# Patient Record
Sex: Male | Born: 1950 | Race: Black or African American | Hispanic: No | Marital: Married | Smoking: Former smoker
Health system: Southern US, Community
[De-identification: ages and names within clinical notes are randomized; demographics above are authoritative.]

## PROBLEM LIST (undated history)

## (undated) DIAGNOSIS — I5032 Chronic diastolic (congestive) heart failure: Secondary | ICD-10-CM

## (undated) DIAGNOSIS — F1911 Other psychoactive substance abuse, in remission: Secondary | ICD-10-CM

## (undated) DIAGNOSIS — Z8673 Personal history of transient ischemic attack (TIA), and cerebral infarction without residual deficits: Secondary | ICD-10-CM

## (undated) DIAGNOSIS — I10 Essential (primary) hypertension: Secondary | ICD-10-CM

## (undated) DIAGNOSIS — Z8709 Personal history of other diseases of the respiratory system: Secondary | ICD-10-CM

## (undated) DIAGNOSIS — I272 Pulmonary hypertension, unspecified: Secondary | ICD-10-CM

## (undated) DIAGNOSIS — Z86718 Personal history of other venous thrombosis and embolism: Secondary | ICD-10-CM

## (undated) DIAGNOSIS — I482 Chronic atrial fibrillation, unspecified: Secondary | ICD-10-CM

## (undated) DIAGNOSIS — G4733 Obstructive sleep apnea (adult) (pediatric): Secondary | ICD-10-CM

## (undated) DIAGNOSIS — N183 Chronic kidney disease, stage 3 unspecified: Secondary | ICD-10-CM

## (undated) DIAGNOSIS — E1140 Type 2 diabetes mellitus with diabetic neuropathy, unspecified: Secondary | ICD-10-CM

## (undated) DIAGNOSIS — E785 Hyperlipidemia, unspecified: Secondary | ICD-10-CM

## (undated) HISTORY — DX: Other psychoactive substance abuse, in remission: F19.11

## (undated) HISTORY — DX: Obstructive sleep apnea (adult) (pediatric): G47.33

## (undated) HISTORY — DX: Personal history of transient ischemic attack (TIA), and cerebral infarction without residual deficits: Z86.73

## (undated) HISTORY — DX: Essential (primary) hypertension: I10

## (undated) HISTORY — DX: Chronic atrial fibrillation, unspecified: I48.20

## (undated) HISTORY — DX: Personal history of other venous thrombosis and embolism: Z86.718

## (undated) HISTORY — DX: Chronic kidney disease, stage 3 unspecified: N18.30

## (undated) HISTORY — DX: Chronic kidney disease, stage 3 (moderate): N18.3

## (undated) HISTORY — DX: Type 2 diabetes mellitus with diabetic neuropathy, unspecified: E11.40

## (undated) HISTORY — DX: Chronic diastolic (congestive) heart failure: I50.32

## (undated) HISTORY — DX: Pulmonary hypertension, unspecified: I27.20

## (undated) HISTORY — DX: Morbid (severe) obesity due to excess calories: E66.01

## (undated) HISTORY — DX: Hyperlipidemia, unspecified: E78.5

## (undated) HISTORY — DX: Personal history of other diseases of the respiratory system: Z87.09

---

## 2004-02-20 ENCOUNTER — Emergency Department: Payer: Self-pay | Admitting: Emergency Medicine

## 2006-11-20 ENCOUNTER — Other Ambulatory Visit: Payer: Self-pay

## 2006-11-20 ENCOUNTER — Inpatient Hospital Stay: Payer: Self-pay | Admitting: Internal Medicine

## 2007-01-11 ENCOUNTER — Emergency Department: Payer: Self-pay | Admitting: Emergency Medicine

## 2007-01-11 ENCOUNTER — Other Ambulatory Visit: Payer: Self-pay

## 2008-02-21 ENCOUNTER — Other Ambulatory Visit: Payer: Self-pay

## 2008-02-21 ENCOUNTER — Emergency Department: Payer: Self-pay | Admitting: Emergency Medicine

## 2008-07-24 IMAGING — CT CT HEAD WITHOUT CONTRAST
2 series · 16 of 30 positions shown, 20 images · non-contrast
Comparison: none

REASON FOR EXAM: dizziness
COMMENTS:

PROCEDURE:     CT  - CT HEAD WITHOUT CONTRAST  - November 20, 2006 [DATE]
RESULT:      No intraaxial or extraaxial pathologic fluid or blood
collections are identified.  No mass lesion is noted. No hydrocephalus.
Mucosal thickening is noted of the frontal, ethmoid and maxillary sinuses.

[Series 2: without · axial · non-contrast · 0.39mm/px · z∈[+490,+616]mm · 13 of 31 slices shown, 17 images]
[im 3/31  brain]
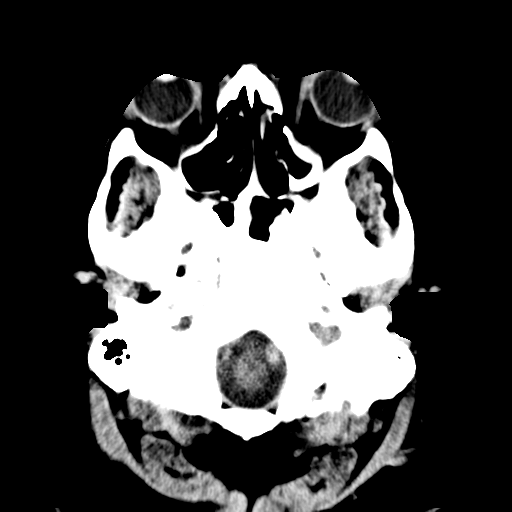
[im 3/31  bone]
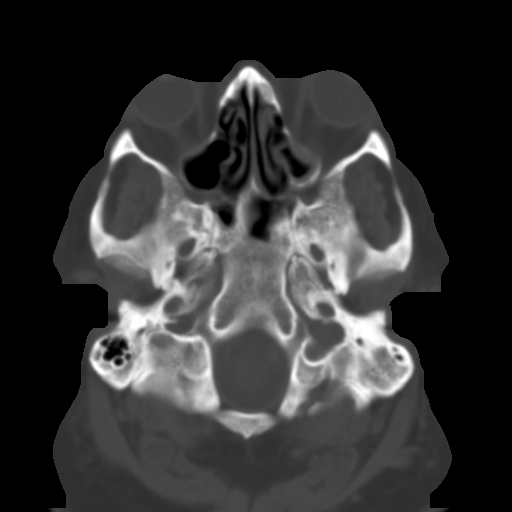
[im 5/31  brain]
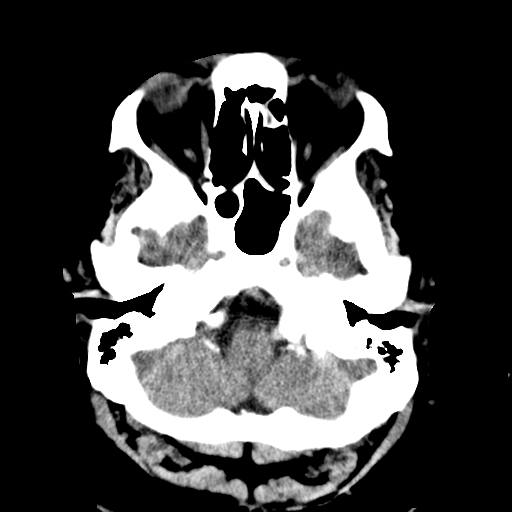
[im 7/31  brain]
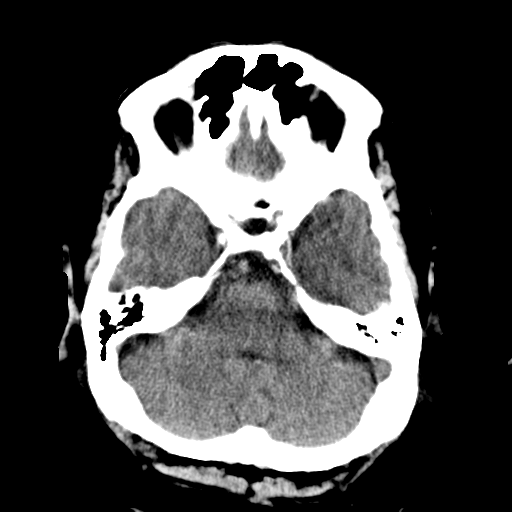
[im 9/31  brain]
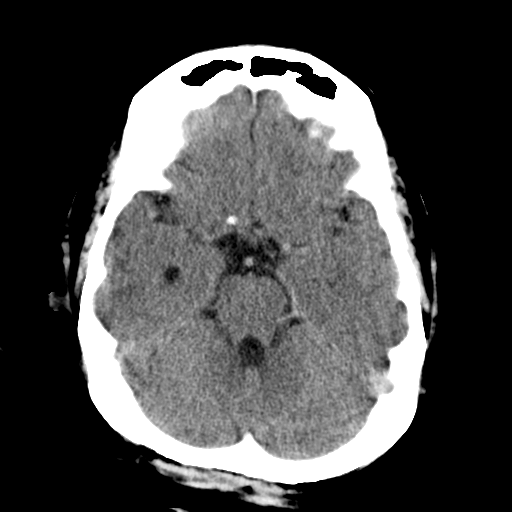
[im 11/31  brain]
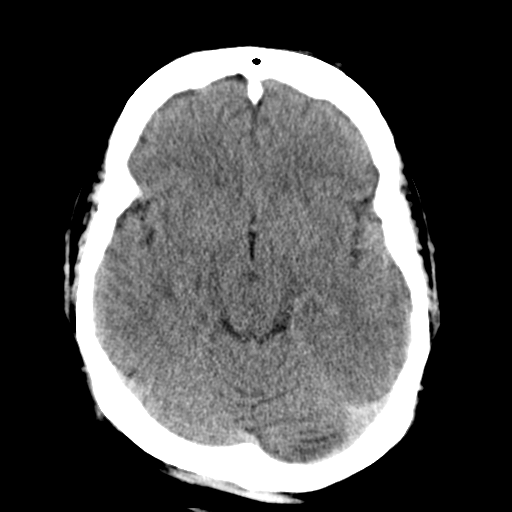
[im 11/31  bone]
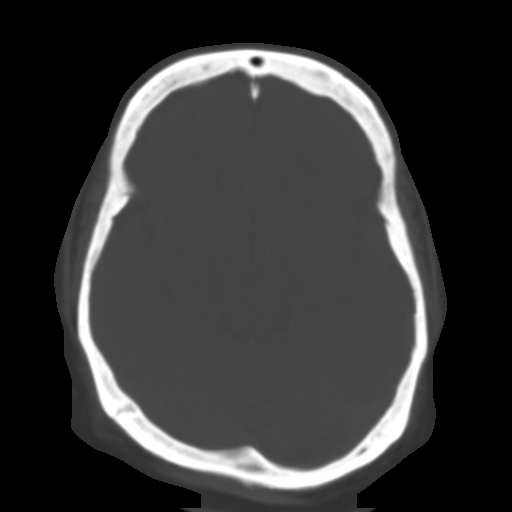
[im 13/31  brain]
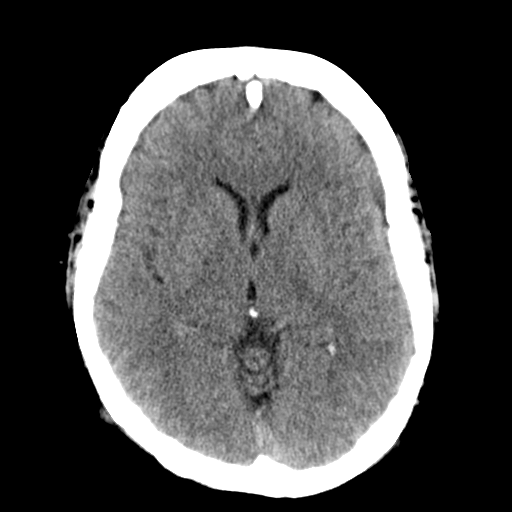
[im 16/31  brain]
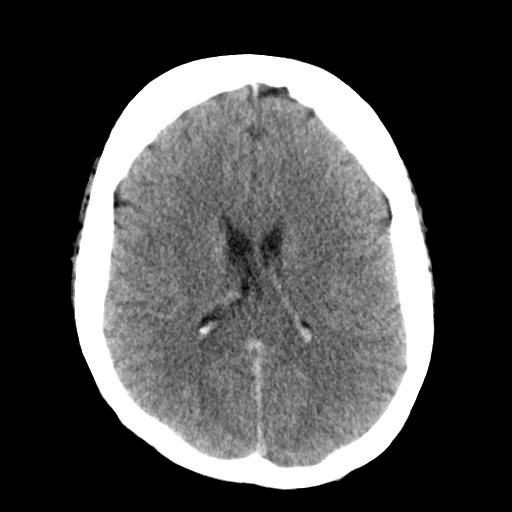
[im 18/31  brain]
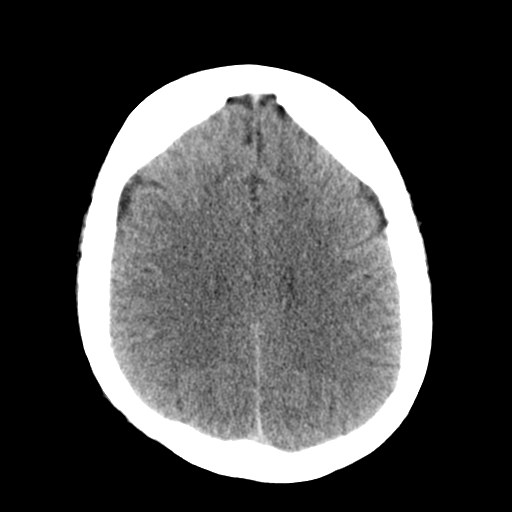
[im 20/31  brain]
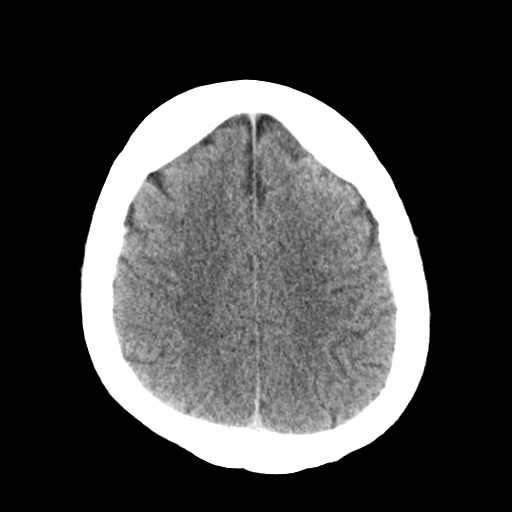
[im 20/31  bone]
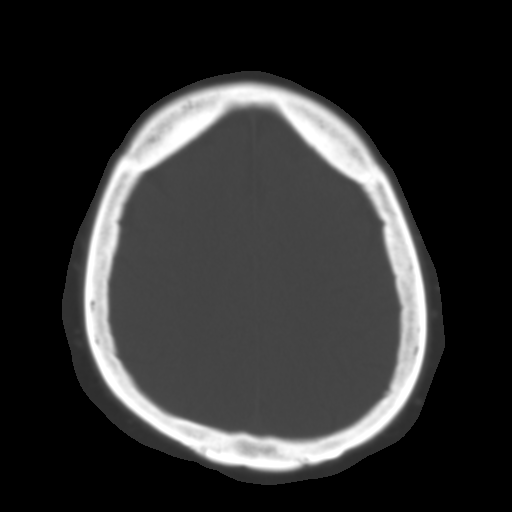
[im 22/31  brain]
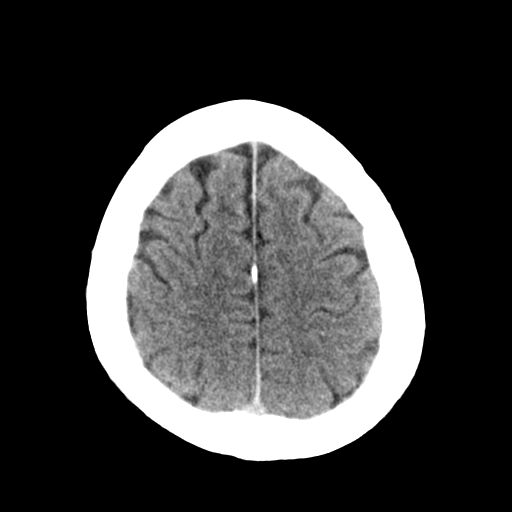
[im 24/31  brain]
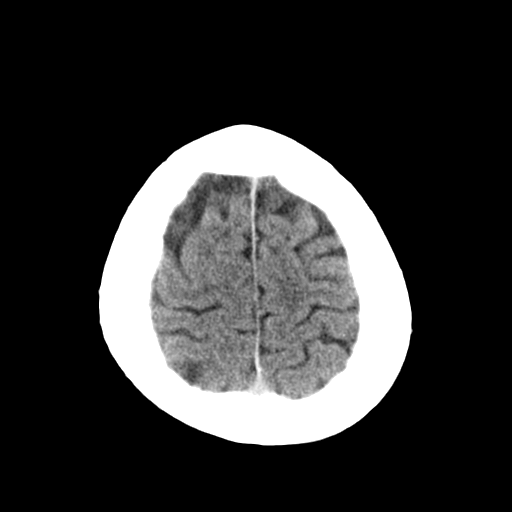
[im 26/31  brain]
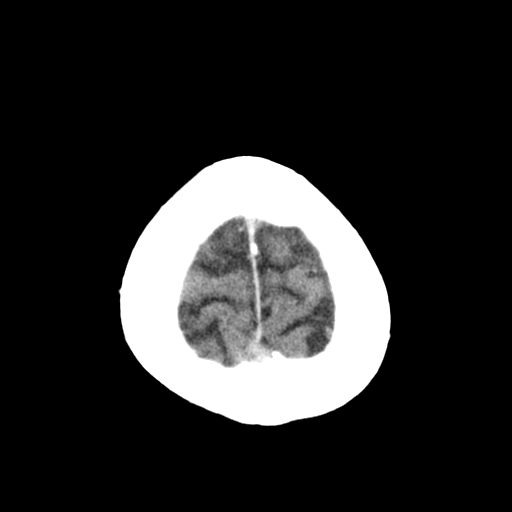
[im 28/31  brain]
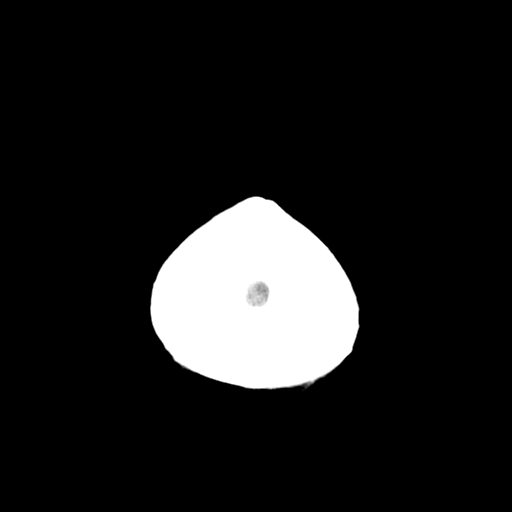
[im 28/31  bone]
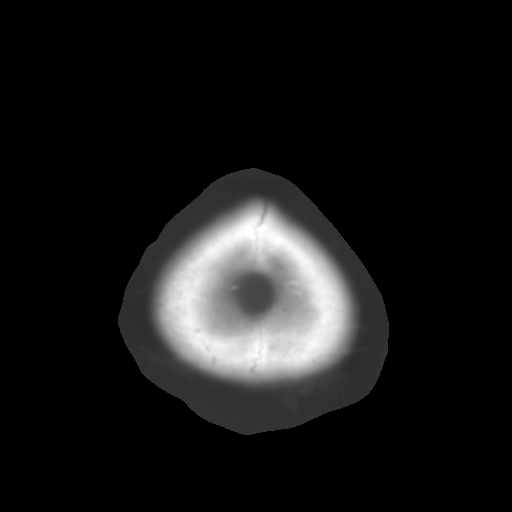

[Series 3: bone · axial · 0.39mm/px · z∈[+490,+530]mm · 3 of 31 slices shown]
[im 3/31  bone]
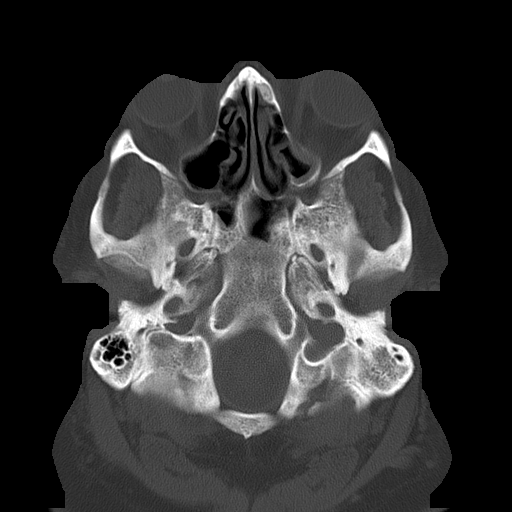
[im 7/31  bone]
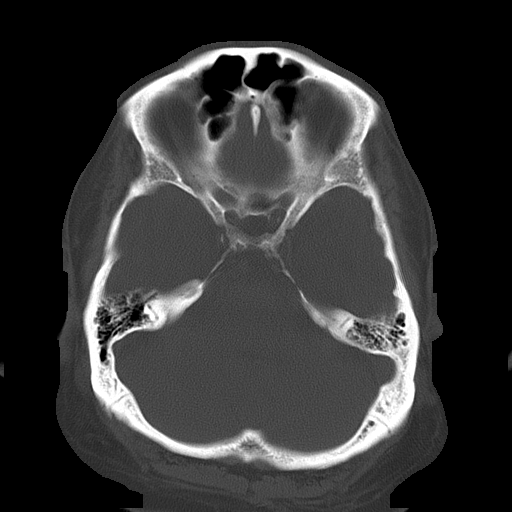
[im 11/31  bone]
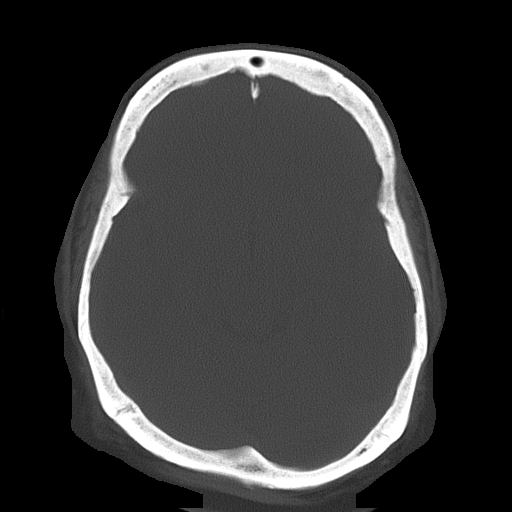

[16 of 30 positions shown; findings below may reference images not displayed]

IMPRESSION: 1.     No acute intracranial abnormality is identified.
2.     Diffuse ethmoid and maxillary chronic sinusitis.
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
## 2008-12-03 ENCOUNTER — Inpatient Hospital Stay: Payer: Self-pay | Admitting: Specialist

## 2008-12-12 ENCOUNTER — Inpatient Hospital Stay: Payer: Self-pay | Admitting: Internal Medicine

## 2009-02-13 ENCOUNTER — Inpatient Hospital Stay: Payer: Self-pay | Admitting: Internal Medicine

## 2009-03-02 ENCOUNTER — Inpatient Hospital Stay: Payer: Self-pay | Admitting: Internal Medicine

## 2009-03-20 ENCOUNTER — Inpatient Hospital Stay: Payer: Self-pay | Admitting: Internal Medicine

## 2009-05-18 ENCOUNTER — Inpatient Hospital Stay: Payer: Self-pay | Admitting: Internal Medicine

## 2009-06-10 ENCOUNTER — Inpatient Hospital Stay: Payer: Self-pay | Admitting: Internal Medicine

## 2010-11-06 ENCOUNTER — Observation Stay: Payer: Self-pay | Admitting: Specialist

## 2010-11-07 DIAGNOSIS — I369 Nonrheumatic tricuspid valve disorder, unspecified: Secondary | ICD-10-CM

## 2010-12-26 ENCOUNTER — Emergency Department: Payer: Self-pay | Admitting: Emergency Medicine

## 2011-02-12 IMAGING — CR DG CHEST 1V PORT
1 series · 1 of 1 positions shown · non-contrast
Comparison: none

REASON FOR EXAM: Chest Pain
COMMENTS:

[view not recorded]
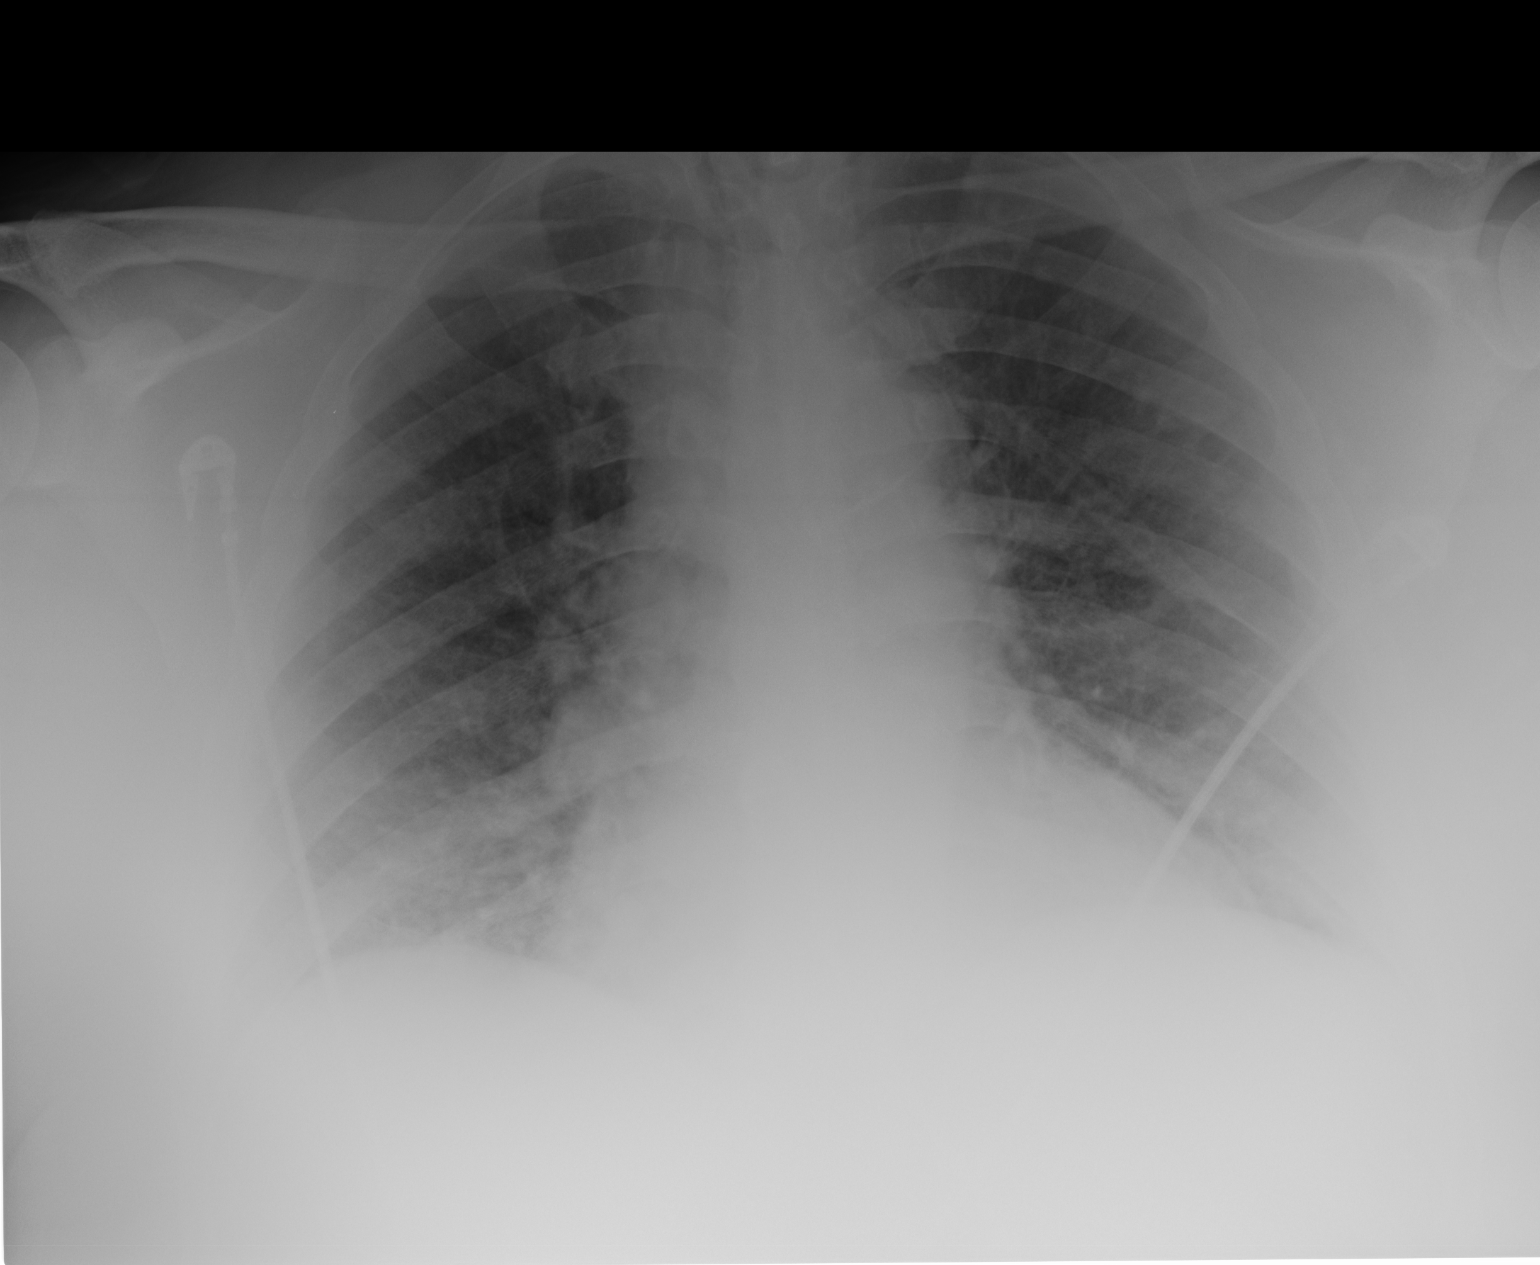

[1 of 1 positions shown; findings below may reference images not displayed]

PROCEDURE:     DXR - DXR PORTABLE CHEST SINGLE VIEW  - June 10, 2009  [DATE]

RESULT:     Comparison is made to a prior exam of 05/18/2009. No pneumonia is
seen. There is slight thickening of the interstitial markings bilaterally.
This has been present previously and may represent chronic inflammatory
change. The possibility of minimal chronic pulmonary vascular congestion
cannot be entirely excluded. Overall, the chest does not appear appreciably
changed as compared to the exam of 05/18/2009. The heart is mildly enlarged.
No pleural effusion is seen. Monitoring electrodes are present.
IMPRESSION: 1. There is mild thickening of the interstitial lung markings. Since the
appearance has been present previously it is uncertain as to whether this is
secondary to chronic inflammatory change or recurrent interstitial edema.
2. No consolidated infiltrative changes indicative of pneumonia are seen.

## 2011-03-08 ENCOUNTER — Inpatient Hospital Stay: Payer: Self-pay | Admitting: Internal Medicine

## 2012-05-29 ENCOUNTER — Inpatient Hospital Stay: Payer: Self-pay | Admitting: Internal Medicine

## 2012-05-29 DIAGNOSIS — I4891 Unspecified atrial fibrillation: Secondary | ICD-10-CM

## 2012-05-29 LAB — DRUG SCREEN, URINE
Amphetamines, Ur Screen: NEGATIVE (ref ?–1000)
Barbiturates, Ur Screen: NEGATIVE (ref ?–200)
MDMA (Ecstasy)Ur Screen: POSITIVE (ref ?–500)
Methadone, Ur Screen: NEGATIVE (ref ?–300)

## 2012-05-29 LAB — TROPONIN I
Troponin-I: 0.04 ng/mL
Troponin-I: 0.11 ng/mL — ABNORMAL HIGH
Troponin-I: 0.08 ng/mL — ABNORMAL HIGH

## 2012-05-29 LAB — PROTIME-INR
INR: 2.3
Prothrombin Time: 25.7 secs — ABNORMAL HIGH (ref 11.5–14.7)

## 2012-05-29 LAB — CBC
HCT: 49.3 % (ref 40.0–52.0)
HGB: 16 g/dL (ref 13.0–18.0)
MCH: 27.1 pg (ref 26.0–34.0)
Platelet: 198 10*3/uL (ref 150–440)
WBC: 14.4 10*3/uL — ABNORMAL HIGH (ref 3.8–10.6)

## 2012-05-29 LAB — BASIC METABOLIC PANEL
Anion Gap: 5 — ABNORMAL LOW (ref 7–16)
EGFR (Non-African Amer.): 35 — ABNORMAL LOW
Glucose: 105 mg/dL — ABNORMAL HIGH (ref 65–99)
Potassium: 4.1 mmol/L (ref 3.5–5.1)

## 2012-05-29 LAB — CK TOTAL AND CKMB (NOT AT ARMC): CK, Total: 253 U/L — ABNORMAL HIGH (ref 35–232)

## 2012-05-30 LAB — CBC WITH DIFFERENTIAL/PLATELET
Basophil #: 0 10*3/uL (ref 0.0–0.1)
Eosinophil %: 1.1 %
HGB: 15 g/dL (ref 13.0–18.0)
Lymphocyte #: 2 10*3/uL (ref 1.0–3.6)
MCHC: 31.5 g/dL — ABNORMAL LOW (ref 32.0–36.0)
Monocyte #: 0.7 x10 3/mm (ref 0.2–1.0)
Monocyte %: 5.2 %
Neutrophil %: 78.2 %
WBC: 13 10*3/uL — ABNORMAL HIGH (ref 3.8–10.6)

## 2012-05-30 LAB — BASIC METABOLIC PANEL
Anion Gap: 6 — ABNORMAL LOW (ref 7–16)
BUN: 21 mg/dL — ABNORMAL HIGH (ref 7–18)
Co2: 29 mmol/L (ref 21–32)
Creatinine: 1.38 mg/dL — ABNORMAL HIGH (ref 0.60–1.30)
EGFR (African American): >60
EGFR (Non-African Amer.): 55 — ABNORMAL LOW
Glucose: 217 mg/dL — ABNORMAL HIGH (ref 65–99)
Osmolality: 285 (ref 275–301)
Sodium: 138 mmol/L (ref 136–145)

## 2012-05-31 LAB — BASIC METABOLIC PANEL
Anion Gap: 6 — ABNORMAL LOW (ref 7–16)
Calcium, Total: 9 mg/dL (ref 8.5–10.1)
Chloride: 100 mmol/L (ref 98–107)
Co2: 30 mmol/L (ref 21–32)
EGFR (African American): >60
EGFR (Non-African Amer.): 53 — ABNORMAL LOW

## 2012-05-31 LAB — CBC WITH DIFFERENTIAL/PLATELET
Basophil %: 0.5 %
HCT: 47.8 % (ref 40.0–52.0)
HGB: 15 g/dL (ref 13.0–18.0)
Lymphocyte #: 2.5 10*3/uL (ref 1.0–3.6)
Lymphocyte %: 22.6 %
MCH: 26.1 pg (ref 26.0–34.0)
MCHC: 31.5 g/dL — ABNORMAL LOW (ref 32.0–36.0)
MCV: 83 fL (ref 80–100)
Monocyte %: 6.4 %
Platelet: 198 10*3/uL (ref 150–440)
RBC: 5.76 10*6/uL (ref 4.40–5.90)
WBC: 11.1 10*3/uL — ABNORMAL HIGH (ref 3.8–10.6)

## 2012-06-01 DIAGNOSIS — I4891 Unspecified atrial fibrillation: Secondary | ICD-10-CM

## 2012-06-01 LAB — PROTIME-INR: INR: 3

## 2012-06-01 LAB — CBC WITH DIFFERENTIAL/PLATELET
Basophil #: 0 10*3/uL (ref 0.0–0.1)
Eosinophil #: 0.2 10*3/uL (ref 0.0–0.7)
Eosinophil %: 2.3 %
HGB: 15.5 g/dL (ref 13.0–18.0)
Lymphocyte #: 2.4 10*3/uL (ref 1.0–3.6)
Lymphocyte %: 22.9 %
MCH: 27 pg (ref 26.0–34.0)
MCV: 84 fL (ref 80–100)
Monocyte #: 0.6 x10 3/mm (ref 0.2–1.0)
Monocyte %: 5.8 %
Neutrophil %: 68.6 %
Platelet: 206 10*3/uL (ref 150–440)
RBC: 5.73 10*6/uL (ref 4.40–5.90)
RDW: 20 % — ABNORMAL HIGH (ref 11.5–14.5)

## 2012-06-01 LAB — BASIC METABOLIC PANEL
Anion Gap: 6 — ABNORMAL LOW (ref 7–16)
BUN: 19 mg/dL — ABNORMAL HIGH (ref 7–18)
Chloride: 99 mmol/L (ref 98–107)
Co2: 31 mmol/L (ref 21–32)
Creatinine: 1.61 mg/dL — ABNORMAL HIGH (ref 0.60–1.30)
EGFR (African American): 53 — ABNORMAL LOW
EGFR (Non-African Amer.): 46 — ABNORMAL LOW
Glucose: 367 mg/dL — ABNORMAL HIGH (ref 65–99)
Osmolality: 289 (ref 275–301)

## 2012-06-02 LAB — BASIC METABOLIC PANEL
Anion Gap: 5 — ABNORMAL LOW (ref 7–16)
BUN: 21 mg/dL — ABNORMAL HIGH (ref 7–18)
Calcium, Total: 9.2 mg/dL (ref 8.5–10.1)
Chloride: 98 mmol/L (ref 98–107)
Creatinine: 1.63 mg/dL — ABNORMAL HIGH (ref 0.60–1.30)
EGFR (Non-African Amer.): 45 — ABNORMAL LOW
Glucose: 281 mg/dL — ABNORMAL HIGH (ref 65–99)

## 2012-06-02 LAB — PROTIME-INR: INR: 3.4

## 2012-06-03 LAB — BASIC METABOLIC PANEL
Anion Gap: 4 — ABNORMAL LOW (ref 7–16)
Co2: 33 mmol/L — ABNORMAL HIGH (ref 21–32)
Creatinine: 1.57 mg/dL — ABNORMAL HIGH (ref 0.60–1.30)
EGFR (African American): 55 — ABNORMAL LOW
EGFR (Non-African Amer.): 47 — ABNORMAL LOW
Glucose: 318 mg/dL — ABNORMAL HIGH (ref 65–99)
Osmolality: 284 (ref 275–301)
Sodium: 134 mmol/L — ABNORMAL LOW (ref 136–145)

## 2012-06-03 LAB — PROTIME-INR
INR: 3.4
Prothrombin Time: 34.3 secs — ABNORMAL HIGH (ref 11.5–14.7)

## 2012-06-03 LAB — DIGOXIN LEVEL: Digoxin: 0.8 ng/mL

## 2012-06-04 DIAGNOSIS — I4891 Unspecified atrial fibrillation: Secondary | ICD-10-CM

## 2012-06-04 LAB — BASIC METABOLIC PANEL
Anion Gap: 7 (ref 7–16)
Calcium, Total: 9.3 mg/dL (ref 8.5–10.1)
Co2: 32 mmol/L (ref 21–32)
Creatinine: 1.66 mg/dL — ABNORMAL HIGH (ref 0.60–1.30)
EGFR (Non-African Amer.): 44 — ABNORMAL LOW
Osmolality: 284 (ref 275–301)
Potassium: 4.4 mmol/L (ref 3.5–5.1)

## 2012-06-04 LAB — PROTIME-INR: INR: 2.7

## 2012-06-10 ENCOUNTER — Encounter: Payer: Self-pay | Admitting: *Deleted

## 2012-06-14 ENCOUNTER — Ambulatory Visit (INDEPENDENT_AMBULATORY_CARE_PROVIDER_SITE_OTHER): Payer: Medicare Other | Admitting: Nurse Practitioner

## 2012-06-14 ENCOUNTER — Encounter: Payer: Self-pay | Admitting: Nurse Practitioner

## 2012-06-14 VITALS — BP 110/70 | HR 112 | Ht 62.0 in | Wt 396.4 lb

## 2012-06-14 DIAGNOSIS — I482 Chronic atrial fibrillation, unspecified: Secondary | ICD-10-CM

## 2012-06-14 DIAGNOSIS — R0602 Shortness of breath: Secondary | ICD-10-CM

## 2012-06-14 DIAGNOSIS — G4733 Obstructive sleep apnea (adult) (pediatric): Secondary | ICD-10-CM

## 2012-06-14 DIAGNOSIS — E1142 Type 2 diabetes mellitus with diabetic polyneuropathy: Secondary | ICD-10-CM

## 2012-06-14 DIAGNOSIS — I272 Pulmonary hypertension, unspecified: Secondary | ICD-10-CM | POA: Insufficient documentation

## 2012-06-14 DIAGNOSIS — I5032 Chronic diastolic (congestive) heart failure: Secondary | ICD-10-CM

## 2012-06-14 DIAGNOSIS — Z8709 Personal history of other diseases of the respiratory system: Secondary | ICD-10-CM | POA: Insufficient documentation

## 2012-06-14 DIAGNOSIS — E1140 Type 2 diabetes mellitus with diabetic neuropathy, unspecified: Secondary | ICD-10-CM | POA: Insufficient documentation

## 2012-06-14 DIAGNOSIS — Z86718 Personal history of other venous thrombosis and embolism: Secondary | ICD-10-CM | POA: Insufficient documentation

## 2012-06-14 DIAGNOSIS — E1149 Type 2 diabetes mellitus with other diabetic neurological complication: Secondary | ICD-10-CM

## 2012-06-14 DIAGNOSIS — Z79899 Other long term (current) drug therapy: Secondary | ICD-10-CM

## 2012-06-14 DIAGNOSIS — I509 Heart failure, unspecified: Secondary | ICD-10-CM

## 2012-06-14 DIAGNOSIS — F1911 Other psychoactive substance abuse, in remission: Secondary | ICD-10-CM | POA: Insufficient documentation

## 2012-06-14 DIAGNOSIS — I10 Essential (primary) hypertension: Secondary | ICD-10-CM

## 2012-06-14 DIAGNOSIS — I4891 Unspecified atrial fibrillation: Secondary | ICD-10-CM

## 2012-06-14 DIAGNOSIS — F1910 Other psychoactive substance abuse, uncomplicated: Secondary | ICD-10-CM

## 2012-06-14 DIAGNOSIS — Z8673 Personal history of transient ischemic attack (TIA), and cerebral infarction without residual deficits: Secondary | ICD-10-CM | POA: Insufficient documentation

## 2012-06-14 DIAGNOSIS — E785 Hyperlipidemia, unspecified: Secondary | ICD-10-CM | POA: Insufficient documentation

## 2012-06-14 MED ORDER — METOPROLOL TARTRATE 100 MG PO TABS: 100.0000 mg | ORAL_TABLET | Freq: Two times a day (BID) | ORAL | Status: AC

## 2012-06-14 NOTE — Progress Notes (Signed)
Patient Name: Seth Dudley Date of Encounter: 06/14/2012  Primary Care Provider:  [facility] Primary Cardiologist:  Concha Se, MD  Patient Profile  62 y/o Af. Am male with recent admission for diast chf and rapid afib who presents for f/u.  Problem List   Past Medical History  Diagnosis Date  . History of TIA (transient ischemic attack)   . History of CVA (cerebrovascular accident)   . Diabetes mellitus with neuropathy   . Morbid obesity   . Hypertension   . Obstructive sleep apnea     a. On CPAP.  Marland Kitchen Chronic diastolic CHF (congestive heart failure)     a. EF 55%  . Pulmonary hypertension   . Chronic a-fib     a. rate-controlled and on coumadin  . History of DVT (deep vein thrombosis)     a. chronic coumadin  . History of COPD     a. on home O2  . History of substance abuse   . Hyperlipidemia   . Chronic kidney disease, stage III (moderate)    History reviewed. No pertinent past surgical history.  Allergies  Allergies  Allergen Reactions  . Citalopram   . Lisinopril   . Vancomycin     HPI  62 y/o male with the above problem list.  He was recently admitted to [facility] on 1/15 with acute on chronic diastolic chf in the setting of rapid afib and acute on chronic renal failure.  During admission, he was diuresed and his meds were adjusted to achieve modest rate control.  He ended up requiring digoxin, high dose coreg and diltiazem, along with amio 200 bid, just to lower his resting HR to 110.  He is chronically on coumadin.  He was d/c'd 10 days ago to a rehab facility and he reports that he has been doing well.  His weight has apparently been stable, though he has not been weighed daily.  He has been working with P.T and says that he was able to walk 80 ft yesterday w/o significant DOE.  He denies chest pain, palpitations, pnd, orthopnea, n, v, dizziness, syncope, or early satiety.  He has chronic LEE and wears wraps daily.  He believes his edema is significantly  improved overall, though he still has edema.  Home Medications  Prior to Admission medications   Medication Sig Start Date End Date Taking? Authorizing Provider  allopurinol (ZYLOPRIM) 100 MG tablet Take 200 mg by mouth daily.   Yes Historical Provider, MD  amiodarone (PACERONE) 200 MG tablet Take 200 mg by mouth 2 (two) times daily.   Yes Historical Provider, MD  atorvastatin (LIPITOR) 80 MG tablet Take 80 mg by mouth daily.   Yes Historical Provider, MD  budesonide-formoterol (SYMBICORT) 160-4.5 MCG/ACT inhaler Inhale 2 puffs into the lungs 2 (two) times daily.   Yes Historical Provider, MD  cholecalciferol (VITAMIN D) 1000 UNITS tablet Take 3,000 Units by mouth daily.   Yes Historical Provider, MD  digoxin (LANOXIN) 0.125 MG tablet Take 0.125 mg by mouth daily.   Yes Historical Provider, MD  diltiazem (TIAZAC) 360 MG 24 hr capsule Take 360 mg by mouth daily.   Yes Historical Provider, MD  docusate sodium (COLACE) 100 MG capsule Take 200 mg by mouth 2 (two) times daily.   Yes Historical Provider, MD  gabapentin (NEURONTIN) 300 MG capsule Take 300 mg by mouth 3 (three) times daily.   Yes Historical Provider, MD  insulin aspart protamine-insulin aspart (NOVOLOG 70/30) (70-30) 100 UNIT/ML injection Inject  into the skin as directed.   Yes Historical Provider, MD  insulin glargine (LANTUS) 100 UNIT/ML injection Inject 55 Units into the skin 2 (two) times daily.   Yes Historical Provider, MD  Ipratropium-Albuterol (COMBIVENT) 20-100 MCG/ACT AERS respimat Inhale 2 puffs into the lungs daily.   Yes Historical Provider, MD  isosorbide dinitrate (ISORDIL) 30 MG tablet Take 30 mg by mouth daily.   Yes Historical Provider, MD  nicotine (NICODERM CQ - DOSED IN MG/24 HOURS) 21 mg/24hr patch Place 1 patch onto the skin daily.   Yes Historical Provider, MD  NON FORMULARY Caps aicin 0.025% topical cream two times daily.   Yes Historical Provider, MD  omeprazole (PRILOSEC) 20 MG capsule Take 20 mg by mouth 2  (two) times daily.   Yes Historical Provider, MD  sennosides-docusate sodium (SENOKOT-S) 8.6-50 MG tablet Take 2 tablets by mouth 2 (two) times daily.   Yes Historical Provider, MD  torsemide (DEMADEX) 20 MG tablet Take 40 mg by mouth daily.    Yes Historical Provider, MD  warfarin (COUMADIN) 5 MG tablet Take 5 mg by mouth daily.   Yes Historical Provider, MD  metoprolol (LOPRESSOR) 100 MG tablet Take 1 tablet (100 mg total) by mouth 2 (two) times daily. 06/14/12   Ok Anis, NP    Review of Systems  As above, he believes he is doing well.  All other systems reviewed and are otherwise negative except as noted above.  Physical Exam  Blood pressure 110/70, pulse 112, height 5\' 2"  (1.575 m), weight 396 lb 6 oz (179.795 kg).  General: Pleasant, NAD Psych: Normal affect. Neuro: Alert and oriented X 3. Moves all extremities spontaneously. HEENT: Normal  Neck: Supple without bruits.  Obese - difficult to assess JVP. Lungs:  Resp regular and unlabored, diminished throughout with scattered rhonchi. Heart: IR, IR, no s3, s4, or murmurs. Abdomen: Soft, non-tender, non-distended, obese, BS + x 4.  Extremities: No clubbing, cyanosis.  Lower legs are wrapped - 1-2+ pitting edema noted.  PT/Radials 2+ and equal bilaterally.  Accessory Clinical Findings  ECG - afib 112, poor R progression.  Assessment & Plan  1.  Chronic diastolic CHF:  He appears to be stable.  He has chronic LEE, though he reports this has been better.  He is able to participate in P.T w/o significant dyspnea and feels that he is getting stronger.  His BP is stable, though his HR remains elevated at rest.  I will switch him from coreg to metoprolol 100mg  bid to see if we can achieve better rate control.  He is already on max dose diltiazem, moderate dose amiodarone, and digoxin.  He will need to be weighed daily and weight gains of 2lbs/day or 5lbs/week should be reported to our office as he may require diuretic adjustment.   I will check a bmet today to evaluate renal fxn on torsemide, given his h/o CKD.  2.  Afib: chronic.  Difficult to rate control.  Adjusting bb regimen as above.  Given his diastolic dysfxn, achieving adequate rate control is important with goal HR of at least < 100.  He is otw asymptomatic.  If rate control remains elusive we may consider referral to EP, though given comorbidities and body habitus, we are likely limited in more invasive approaches (afib ablation/avn ablation and ppm placement) and rhythm control with meds alone would likely prove difficult.  3.  COPD:  On home O2.  He no longer smokes.  4.  OSA:  Uses cpap.  5.  Morbid obesity:  Taking P.T.  He knows he needs to lose weight but does not have a plan.  6.  Drug Abuse:  Urine screen + for MDMA ('molly') during hospitalization.  He is currently at rehab center and not using.  7.  Dispo:  bmet and dig level today.  F/u in 2 wks to reassess rate control.   Nicolasa Ducking, NP 06/14/2012, 9:15 AM

## 2012-06-14 NOTE — Patient Instructions (Addendum)
Your physician wants you to follow-up in: 2 weeks with Dr. Mariah Milling. You will receive a reminder letter in the mail two months in advance. If you don't receive a letter, please call our office to schedule the follow-up appointment.  Your physician has recommended you make the following change in your medication:  -stop coreg/carvedilol -start metoprolol tartrate 100 mg twice daily

## 2012-06-14 NOTE — Addendum Note (Signed)
Addended by: Festus Aloe on: 06/14/2012 09:32 AM   Modules accepted: Orders

## 2012-06-21 ENCOUNTER — Telehealth: Payer: Self-pay

## 2012-06-21 NOTE — Telephone Encounter (Signed)
Trying to track down labs ordered by [place] 1/31 Pt says he lives at [facility] and these labs were drawn there. I will call to get results

## 2012-06-21 NOTE — Telephone Encounter (Signed)
Attempted to reach someone taking care of pt at [facility] Unable to get anyone to come to phone Will try again Monday 2/10

## 2012-06-24 NOTE — Telephone Encounter (Signed)
I spoke with rep at [facility] She says pt was d/c last week When I asked for these lab results, she states she does not have these, like they were never drawn I will make Ward Givens, NP aware

## 2012-06-27 ENCOUNTER — Ambulatory Visit: Payer: Medicare Other | Admitting: Cardiovascular Disease

## 2012-07-04 ENCOUNTER — Ambulatory Visit: Payer: Medicare Other | Admitting: Cardiovascular Disease

## 2012-07-26 ENCOUNTER — Ambulatory Visit: Payer: Medicare Other | Admitting: Cardiovascular Disease

## 2014-01-31 IMAGING — CR DG CHEST 1V PORT
1 series · 1 of 1 positions shown · non-contrast
Comparison: none

REASON FOR EXAM: chest pain dyspnea
COMMENTS:

[ap]
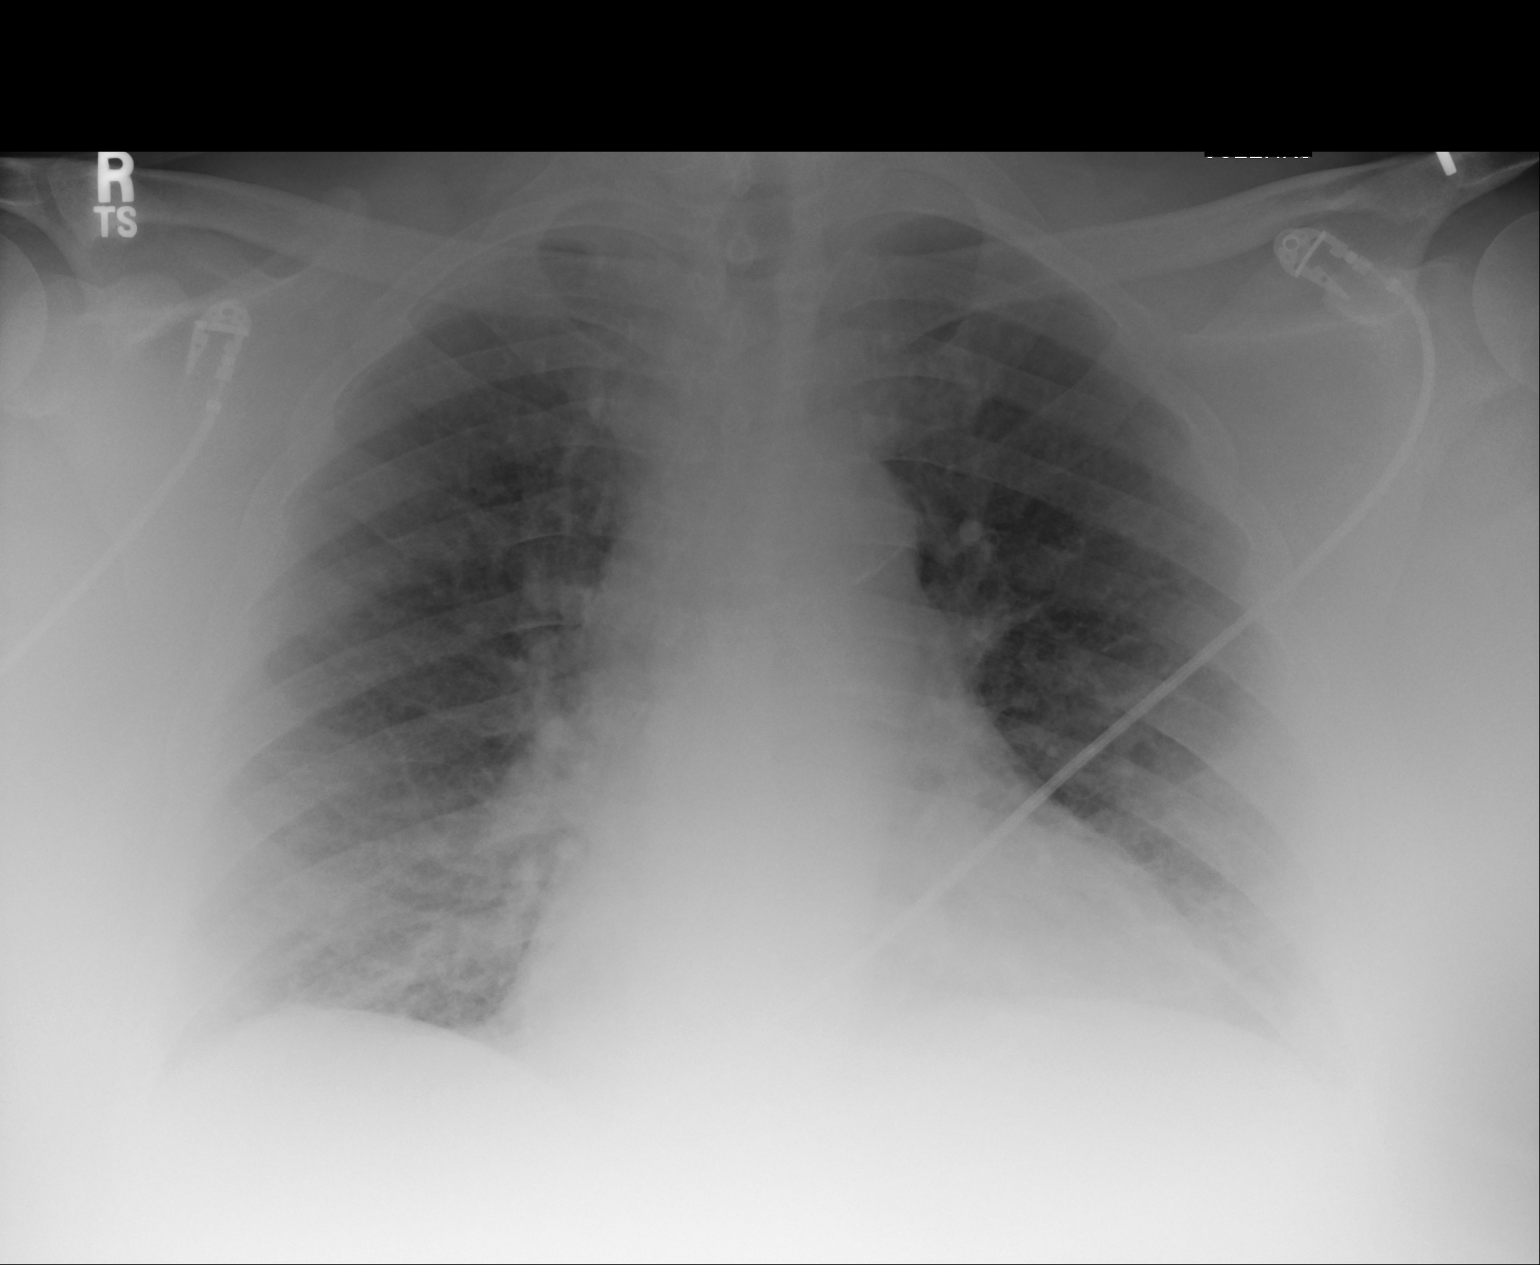

[1 of 1 positions shown; findings below may reference images not displayed]

PROCEDURE:     DXR - DXR PORTABLE CHEST SINGLE VIEW  - May 29, 2012  [DATE]

RESULT:     Comparison is made to the study March 07, 2011.

The lungs are adequately inflated. The cardiac silhouette is enlarged. The
pulmonary vascularity is prominent centrally and the interstitial markings
are increased. There is no pleural effusion.
IMPRESSION: The findings are consistent with low-grade CHF with mild
interstitial edema. There is no focal pneumonia nor evidence of a pleural
effusion. A followup PA and lateral chest x-ray would be useful when the
patient can tolerate the procedure.

[REDACTED]

## 2014-09-04 NOTE — Consult Note (Signed)
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   he was lying in bed he had episode of chest pain with shortness of breath, midsternal and radiating to the left shoulder. Symptoms seem to wax and wane from midnight for the next several hours and he could not sleep. As he was very symptomatic, he called EMTs.  Upon presentation to the ED, the patient was found to be in atrial fibrillation with RVR with heart rate in the 180s. The patient was given IV metoprolol, which slowed his heart rate to the 140s. He was given nitro paste which resolved his chest pain. The patient was given another dose of IV Cardizem.  Lab work showed elevated BNP    Present Illness . DRUG ALLERGIES: CITALOPRAM, LISINOPRIL AND VANCOMYCIN.   PAST SURGICAL HISTORY: None.   SOCIAL HISTORY: Smokes 1 pack per day. No alcohol or IV drug use.   FAMILY HISTORY: Significant for diabetes and coronary artery disease.   Physical Exam:   GEN well developed, well nourished, no acute distress, obese    HEENT red conjunctivae    NECK supple  No masses    RESP normal resp effort  clear BS    CARD Irregular rate and rhythm  Tachycardic  No murmur    ABD denies tenderness  soft    LYMPH negative neck    EXTR positive edema, Lymphedema    SKIN normal to palpation    NEURO motor/sensory function intact    PSYCH alert, A+O to time, place, person, good insight   Review of Systems:   Subjective/Chief Complaint Shortness of breath, chest tightness    General: Fatigue  Weakness     Skin: No Complaints    ENT: No Complaints    Eyes: No Complaints    Neck: No Complaints    Respiratory: Frequent cough  Short of breath    Cardiovascular: Dyspnea    Gastrointestinal: No Complaints    Genitourinary: No Complaints    Vascular: No Complaints    Musculoskeletal: No Complaints    Neurologic: No Complaints    Hematologic: No Complaints    Endocrine: No Complaints    Psychiatric: No Complaints    Review of Systems: All other systems were reviewed and found to be negative    Medications/Allergies Reviewed Medications/Allergies reviewed     Gout:    a-fib:    CHF:    TIA x2:    Hypercholesterolemia:    ETOH:    Drug Abuse:    Diabetes Mellitus,Type I (IDD):    MI x 2:    HTN:        Admit Diagnosis:   ATRIAL FIBRILLATION: 29-May-2012, Active, ATRIAL FIBRILLATION      Admit Reason:   AF (atrial fibrillation): (427.31) Active, ICD9, Atrial fibrillation      Chronic Problem:   Chronic left-sided CHF (congestive heart failure): (428.1) Active, ICD9, Left heart failure   Acute on chronic diastolic heart failure: (287.68) Active, ICD9, Acute on chronic diastolic heart failure  Home Medications: Medication Instructions Status  Coumadin 7 milligram(s) orally once a day at 5pm on [place], [place]  SATURDAY SUNDAY.  Active  Coumadin 6 mg oral tablet 1 tab(s) orally once a day at 5pm on MONDAY, [facility], FRIDAY Active  allopurinol 100 mg oral tablet 2 tab(s) orally once a day Active  albuterol-ipratropium CFC free 100 mcg-20 mcg/inh inhalation aerosol 1 puff(s) inhaled 2 times a day Active  acetaminophen 325 mg oral tablet 1-2 tab(s) orally every 4 to 6 hours, As Needed- for Pain  Active  atorvastatin 80 mg oral tablet 1 tab(s) orally once a day (at bedtime) Active  budesonide-formoterol 160 mcg-4.5 mcg/inh inhalation aerosol 2 puff(s) inhaled 2 times a day Active  capsaicin 0.025% topical cream Apply topically to affected area 2 times a day  Active  Colace 100 mg oral capsule 2 cap(s) orally 2 times a day Active  Vitamin D3 1000 intl units oral tablet 3 tab(s) orally once a day Active  Senna S 50 mg-8.6 mg oral tablet 2 tab(s) orally 2 times a day Active  gabapentin 300 mg oral capsule 1 cap(s) orally 3 times a day Active  Novolin R 100 units/mL injectable solution unit(s) injectable 3 times a day Active  insulin glargine 100 units/mL subcutaneous solution 55 unit(s) subcutaneous 2 times a day Active  losartan 25 mg oral tablet 1 tab(s) orally once a day Active  omeprazole 20 mg oral delayed release capsule 1 cap(s) orally 2 times a day Active  acetaminophen-oxycodone 325 mg-5 mg oral tablet 1 tab(s) orally 4 times a day, As Needed- for Pain  Active  spironolactone 25 mg oral tablet 1 tab(s) orally once a day Active  sildenafil 100 mg oral tablet 1 tab(s) orally once a day, As Needed for sexual activity Active  torsemide 20 mg oral tablet 1 tab(s) orally once a day Active  carvedilol 12.5 mg oral tablet 1 tab(s) orally 2 times a day Active   Lab Results:  Routine Chem:  15-Jan-14 05:43    B-Type Natriuretic Peptide [facility])  1382 (Result(s) reported on 29 May 2012 at 06:36AM.)   Glucose, Serum  105   BUN  25   Creatinine (comp)  2.03   Sodium, Serum 142   Potassium, Serum 4.1   Chloride, Serum 107   CO2, Serum 30   Calcium (Total), Serum 9.0   Anion Gap  5   Osmolality (calc) 288   eGFR (African American)  40   eGFR (Non-African American)  35 (eGFR values <27m/min/1.73 m2 may be an indication of chronic kidney disease (CKD). Calculated eGFR is useful in patients with stable renal function. The eGFR calculation will not be reliable in acutely ill patients when serum creatinine is changing rapidly. It is not useful in  patients on dialysis. The eGFR calculation may not be applicable to patients at the low and high extremes of body sizes, pregnant women, and vegetarians.)  Cardiac:  15-Jan-14 05:43    Troponin I 0.04  (0.00-0.05 0.05 ng/mL or less: NEGATIVE  Repeat testing in 3-6 hrs  if clinically indicated. >0.05 ng/mL: POTENTIAL  MYOCARDIAL INJURY. Repeat  testing in 3-6 hrs if  clinically indicated. NOTE: An increase or decrease  of 30% or more on serial  testing suggests a  clinically important change)   CK, Total  253   CPK-MB, Serum 1.9 (Result(s) reported on 29 May 2012 at 0[facility])  Routine Coag:  15-Jan-14 05:43    Prothrombin  25.7   INR 2.3 (INR reference interval applies to patients on anticoagulant therapy. A single INR therapeutic range for coumarins is not optimal for all  indications; however, the suggested range for most indications is 2.0 - 3.0. Exceptions to the INR Reference Range may include: Prosthetic heart valves, acute myocardial infarction, prevention of myocardial infarction, and combinations of aspirin and anticoagulant. The need for a higher or lower target INR must be assessed individually. Reference: The Pharmacology and Management of the Vitamin K  antagonists: the seventh ACCP Conference on Antithrombotic and Thrombolytic Therapy. VIFBP.7943 Sept:126 (3suppl): [id number]. A HCT value >55% may artifactually increase the PT.  In one study,  the increase was an average of 25%. Reference:  "Effect on Routine and Special Coagulation Testing Values of Citrate Anticoagulant Adjustment in Patients with High HCT Values." American Journal of Clinical Pathology 2006;126:400-405.)  Routine Hem:  15-Jan-14 05:43    WBC (CBC)  14.4   RBC (CBC)  5.92   Hemoglobin (CBC) 16.0   Hematocrit (CBC) 49.3   Platelet Count (CBC) 198 (Result(s) reported on 29 May 2012 at 06:24AM.)   MCV 83   MCH 27.1   MCHC 32.6   RDW  20.2   EKG:   Interpretation EKG shows atrial fibrillation with rate 180 bpm.    Citalopram: Unknown  Lisinopril: Unknown  Vancomycin HCl: Unknown  Vital Signs/Nurse's Notes: **Vital Signs.:   15-Jan-14 10:20   Vital Signs Type Admission   Temperature  Temperature (F) 97.8   Celsius 36.5   Temperature Source oral   Pulse Pulse 125   Respirations Respirations 14   Systolic BP Systolic BP 276   Diastolic BP (mmHg) Diastolic BP (mmHg) 56   Mean BP 75   Pulse Ox % Pulse Ox % 94   Pulse Ox Activity Level  At rest   Oxygen Delivery 2L     Impression 64 year old [place] American male with morbid obesity , diabetes, hypertension, obstructive sleep apnea with CPAP, history of CVA, diastolic congestive heart failure with ejection fraction more than 55%, history of chronic kidney disease with baseline creatinine of 1.7 to 1.8, mild pulmonary hypertension, chronic atrial fibrillation and 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  1) atrial fibrillation with RVR Currently on diltiazem infusion  This can be  changed to diltiazem by mouth, 60 mg every 6 hours if blood pressure tolerates Could also restart Coreg 12.5 mg twice a day If rate continues to be fast, would add digoxin I suspect his atrial fibrillation has been a chronic thing. He reports that it started earlier this year. No prior EKGs available. [place] records not available. --Not a good candidate for cardioversion given his body habitus and the need for general anesthesia. If he has been in atrial fibrillation chronically, would likely be unsuccessful in maintaining normal sinus rhythm  2) Morbid obesity: Likely contributing to his diastolic CHF, hypoventilation issues  3) chronic diastolic CHF Will likely need continued diuretic regimen given his body habitus, atrial fibrillation, history of diastolic CHF We'll need to watch renal function carefully  4) diabetes Per medical service   Electronic Signatures: Ida Rogue (MD)  (Signed 15-Jan-14 18:10)  Authored: General Aspect/Present Illness, History and Physical Exam, Review of System, Past Medical History, Health Issues, Home  Medications, Labs, EKG , Allergies, Vital Signs/Nurse's Notes, Impression/Plan   Last Updated: 15-Jan-14 18:10 by Ida Rogue (MD)

## 2014-09-04 NOTE — H&P (Signed)
PATIENT NAME:  Seth Dudley, Seth Dudley MR#:  [id number] DATE OF BIRTH:  1950/10/02  DATE OF ADMISSION:  05/29/2012  REFERRING PHYSICIAN: Bayard Males, MD  PRIMARY CARE PHYSICIAN: [facility]  CHIEF COMPLAINT: Chest pain and shortness of breath.   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 The patient reports while he was lying in bed he had episode of chest pain. It was accompanied by shortness of breath. He described chest pain as midsternal and radiating to the left shoulder. Denies any nausea, vomiting, sweating, diaphoresis, palpitations or dizziness. It was accompanied by mild shortness of breath. Upon presentation to the ED, the patient was found to be in atrial fibrillation with RVR with heart rate in the 180s. The patient was given IV metoprolol, which slowed his heart rate to the 140s. He was given nitro paste which resolved his chest pain. The patient was given another dose of IV Cardizem which did not help with lowering his heart rate, so hospitalist service was requested to admit the patient for further management and treatment for his afib with RVR. The patient had elevated proBNP, but his chest x-ray did not show any volume overload. He has baseline lower extremity edema which he reports has not worsened recently. The patient is known to have history of chronic kidney disease with baseline creatinine of 1.7 to 1.8; currently it is more than 2. As well he is known to be on warfarin for DVT and afib with his INR  being therapeutic at 2.3. Unfortunately, the patient did not bring the dosages of his medications so the dosages are unknown at this point and he reports his wife will bring the list when she comes to visit him this afternoon.   PAST MEDICAL HISTORY: 1. History of TIA and CVA in the past.  2. Diabetes mellitus with neuropathy.  3. Morbid obesity.  4. Hypertension.  5. Obstructive sleep apnea with CPAP and 3 liters oxygen.  6. History of CVA. 7. Echocardiogram showing diastolic CHF with EF of 55% and pulmonary hypertension.  8. Chronic afib.  9. History of DVT, on Warfarin. 10. History of COPD, not on oxygen at home for COPD, only at night for obstructive sleep apnea.  11. History of substance abuse.  12. Hyperlipidemia.   HOME MEDICATIONS: 1. Coreg 12.5 mg p.o. 2 times a day. 2. Gabapentin 600 mg 3 times daily. 3. Losartan. 4. Pravastatin.  5. Warfarin.  6. Lantus.  7. Spironolactone. 8. Torsemide.   DRUG ALLERGIES: CITALOPRAM, LISINOPRIL AND VANCOMYCIN.   PAST SURGICAL HISTORY: None.   SOCIAL HISTORY: Smokes 1 pack per day. No alcohol or IV drug use.   FAMILY HISTORY: Significant for diabetes and coronary artery disease.   REVIEW OF SYSTEMS:  CONSTITUTIONAL: Denies any fever or chills. Complains of generalized weakness and fatigue.   EYES: Denies blurry vision, double vision, pain, redness or inflammation.   ENT: Denies tinnitus, ear pain, hearing loss, epistaxis or discharge.   RESPIRATORY: Denies cough, wheezing or hemoptysis. Has complaints of dyspnea.   CARDIOVASCULAR:  Has complaints of chest pain, resolved. Has baseline edema. Has  complaints of palpitations.   GASTROINTESTINAL: Denies nausea, vomiting, diarrhea, abdominal pain, hematemesis, jaundice or rectal bleed.   GENITOURINARY: Denies dysuria, hematuria or renal colic.  ENDOCRINE: Denies polyuria, polydipsia, heat or cold intolerance.   HEMATOLOGY: Denies anemia, easy bruising or bleeding diathesis.    INTEGUMENTARY: Denies acne, rash or skin lesions.   MUSCULOSKELETAL: Has complaints of gout, arthritis, swelling of lower extremities and complaints of chronic lower back pain.   NEUROLOGIC: Denies ataxia, dementia, headache, migraine, seizures or memory loss.   PSYCHIATRIC: Denies anxiety, insomnia, schizophrenia, bipolar disorder or alcohol abuse.   PHYSICAL EXAMINATION:  VITAL SIGNS: Temperature is 98.7, pulse 127, respiratory rate 26, blood pressure 129/84 and saturating 94% on oxygen.   GENERAL: Morbidly obese male who lies comfortable in bed in no apparent distress.   HEENT: Head atraumatic, normocephalic. Pupils equal and reactive to light. Pink conjunctivae. Anicteric sclerae. Moist oral mucosa. 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   NECK: Supple. No thyromegaly. No JVD.   CARDIOVASCULAR: The patient had good air entry bilaterally with mild rales at the bases. No wheezing. No use of accessory muscles.   CARDIOVASCULAR: S1 and S2 heard. No rubs, murmurs or gallops. Regular rate and rhythm.   EXTREMITIES: The patient has +2 to 3 bilateral lower extremity edema. Extremities tender to touch which he reports is due to neuropathy.   ABDOMEN: Obese, soft, nontender and nondistended. Bowel sounds present.   RECTAL: Deferred.  MUSCULOSKELETAL: Able to move all extremities. No cyanosis. No degenerative joint disease or kyphosis.   SKIN: No rash. Normal skin turgor.   LYMPH: No adenopathy in cervical region.   PSYCHIATRIC: Appropriate affect. Awake and alert x 3. Intact judgment and insight.   NEUROLOGIC: Cranial nerves grossly intact. As mentioned earlier with left eye ptosis since birth.   PERTINENT LABS: Glucose 105, proBNP 1382, BUN 25, creatinine 2.03, sodium 142, potassium 4.1, chloride 107 and CO2 30. Troponin 0.04. CK-MB 1.9. White blood cells 14.4, hemoglobin 16, hematocrit 49.3 and platelets 198. INR 2.3   EKG is showing atrial fibrillation with  ventricular rate of 182.   ASSESSMENT AND PLAN: 1. Chest pain and shortness of breath. This is most likely related to his afib with RVR, as it resolved after his heart rate slowed with metoprolol. The patient is currently chest pain free. First troponin is negative. We will continue to cycle troponin.  2. Atrial fibrillation with rapid ventricular response. The patient had IV metoprolol and Cardizem pushes in the ED without much improvement of the heart rate, so he will be started on Cardizem drip and will be started on his carvedilol dose at home and at one point hopefully we can switch him to p.o. Cardizem or increase his Coreg dose gradually. We will consult cardiology service.  3. Acute diastolic congestive heart failure. The patient had last echocardiogram showing ejection fraction of 55% with diastolic dysfunction. His CHF is most likely related exacerbated due to his afib with RVR. Hopefully when his heart rate is controlled his CHF will improve. We will hold p.o. Torsemide and spironolactone and start him on IV Lasix. We will diurese very gently given his worsening renal function.  4. Acute on chronic renal failure. We will hold losartan. We will monitor closely while on IV Lasix.  5. Obstructive sleep apnea. We will continue with CPAP and oxygen at bedtime.  6. History of deep vein thrombosis. The patient is on warfarin. INR is therapeutic.  7. Diabetes mellitus. Lantus dose is  currently unknown so will have him on insulin sliding scale until his wife brings his meds.  8. Hyperlipidemia. Continue with statin.  9. Tobacco abuse. The patient was counseled and will have him on nicotine patch.  10. DVT prophylaxis. The patient was on warfarin with therapeutic INR. 11. GI prophylaxis. On PPI.   CODE STATUS: FULL CODE.   TOTAL TIME SPENT ON ADMISSION AND PATIENT CARE: 60 minutes.  ____________________________ Starleen Arms, MD dse:sb D: 05/29/2012 08:56:22 ET T: 05/29/2012 09:41:20  ET JOB#: [id number]  cc: Starleen Arms, MD, <Dictator> Amarii Amy Teena Irani MD ELECTRONICALLY SIGNED 05/31/2012 7:36

## 2014-09-04 NOTE — Discharge Summary (Signed)
PATIENT NAME:  Seth Dudley, Seth Dudley MR#:  [id number] DATE OF BIRTH:  06/10/1950  DATE OF ADMISSION:  05/29/2012 DATE OF DISCHARGE:  06/04/2012  PRIMARY CARE PHYSICIAN: None local.  CONSULTING PHYSICIAN: Julien Nordmannimothy Gollan, MD - Cardiology.  DISCHARGE DIAGNOSES:  1. Atrial fibrillation with rapid ventricular response.  2. Acute on chronic congestive heart failure with diastolic dysfunction.  3. Acute and chronic respiratory failure.  4. Chronic obstructive pulmonary disease.  5. Obstructive sleep apnea. 6. Morbid obesity.  7. Acute renal failure on chronic kidney disease.  8. Diabetes.  9. Chronic deep venous thrombosis.  10. Tobacco abuse.   CONDITION: Stable.   CODE STATUS: FULL CODE.   HOME MEDICATIONS: [facility] medication reconciliation list.   HOME OXYGEN: The patient needs home oxygen 4 liters by nasal cannula.   DIET: Low sodium, low fat, low cholesterol, ADA diet.   ACTIVITY: As tolerated.   FOLLOW-UP CARE: Follow up with PCP within 1 to 2 weeks and follow up with Dr. Mariah MillingGollan rhythm 1 week. In addition, the patient needs follow-up INR with PCP and CPAP at night, smoking cessation.   REASON FOR ADMISSION: Chest pain and shortness of breath.   HOSPITAL COURSE: The patient is a 64 year old African American male with a history of hypertension, diabetes, OSA, COPD, congestive heart failure with ejection fraction more than 55%, chronic afib and DVT who presented to the ED with chest pain and shortness of breath. The patient was noted to have afib with RVR in 180s and was given IV Lopressor and then IV Cardizem, but the patient still had afib with RVR so the patient was started with Cardizem drip and admitted to the CCU. In addition, the patient had elevated BNP. For detailed history and physical examination, please refer to the admission note dictated by Dr. Randol KernElgergawy. On admission date, the patient'Dudley pro-BNP was 1382, BUN 25, creatinine  2.03, sodium 142, potassium 4.1, chloride 107, WBC 14.4, hemoglobin 16 and INR 2.3. After admission the patient has been treated with Lasix IV push for acute and chronic congestive heart failure. In addition, the patient was on Cardizem drip in the CCU for afib with RVR. The patient'Dudley heart rate has been improving down to about 110. Dr. Mariah MillingGollan evaluated the patient and suggested to change to p.o. Cardizem and add digoxin. However, the patient'Dudley heart rate was still about 110 so Dr. Mariah MillingGollan suggested to add amiodarone 200 mg p.o. 2 times a day. He also suggested to change Lasix to torsemide 40 mg p.o. in the morning and 20 mg p.o. in p.m.   For acute and chronic respiratory failure. The patient continues to need oxygen, about 4 liters, and CPAP at night due to acute respiratory failure. We tried to wean off the oxygen, but the patient'Dudley O2 saturation dropped after weaning oxygen. It appears that the patient'Dudley needs home oxygen 4 liters by nasal cannula at home. Acute and chronic respiratory failure is possibly due to the patient'Dudley underlying disease with OSA, morbid obesity, COPD and CHF.   For chronic DVT and afib, the patient has been treated with Coumadin 5 mg p.o. daily. INR is therapeutic.   For diabetes, the patient has been treated with Lantus 55 units sub-Q 2 times a day.   For acute on chronic kidney disease, the patient'Dudley creatinine decreased to 1.66.   For afib, in addition to Cardizem, amiodarone and digoxin, Dr. Mariah MillingGollan suggested Coreg 25 mg p.o. daily. T  The patient has no complaints  of shortness of breath or wheezing. Lung sounds are clear, but the patient continues to need oxygen 4 liters by nasal cannula. The patient'Dudley O2 saturation decreased to 80s with decreasing oxygenation, so the patient needs home oxygen 4 liters at home. According to physical therapy evaluation, the patient needs subacute rehab. The patient'Dudley heart rate is still 110, but is stable. The patient is clinically stable  and will be discharged to a skilled nursing facility today. I discussed the patient'Dudley discharge plan with the patient, case manager and Dr. Mariah Milling.   TIME SPENT: About 40 minutes.  ____________________________ Shaune Pollack, MD qc:sb D: 06/04/2012 09:49:19 ET T: 06/04/2012 10:06:28 ET JOB#: [id number]  cc: Shaune Pollack, MD, <Dictator> Shaune Pollack MD ELECTRONICALLY SIGNED 06/04/2012 16:30

## 2017-10-01 ENCOUNTER — Emergency Department (HOSPITAL_COMMUNITY): Payer: Medicare Other

## 2017-10-01 ENCOUNTER — Telehealth: Payer: Self-pay | Admitting: Internal Medicine

## 2017-10-01 ENCOUNTER — Inpatient Hospital Stay (HOSPITAL_COMMUNITY)
Admission: EM | Admit: 2017-10-01 | Discharge: 2017-10-04 | DRG: 193 | Disposition: A | Discharge status: Home Health | Payer: Medicare Other | Attending: Internal Medicine | Admitting: Internal Medicine

## 2017-10-01 DIAGNOSIS — I272 Pulmonary hypertension, unspecified: Secondary | ICD-10-CM | POA: Diagnosis present

## 2017-10-01 DIAGNOSIS — J3489 Other specified disorders of nose and nasal sinuses: Secondary | ICD-10-CM | POA: Diagnosis present

## 2017-10-01 DIAGNOSIS — E1140 Type 2 diabetes mellitus with diabetic neuropathy, unspecified: Secondary | ICD-10-CM | POA: Diagnosis present

## 2017-10-01 DIAGNOSIS — I482 Chronic atrial fibrillation: Secondary | ICD-10-CM | POA: Diagnosis present

## 2017-10-01 DIAGNOSIS — I5032 Chronic diastolic (congestive) heart failure: Secondary | ICD-10-CM | POA: Diagnosis present

## 2017-10-01 DIAGNOSIS — E785 Hyperlipidemia, unspecified: Secondary | ICD-10-CM | POA: Diagnosis present

## 2017-10-01 DIAGNOSIS — Z7951 Long term (current) use of inhaled steroids: Secondary | ICD-10-CM

## 2017-10-01 DIAGNOSIS — J440 Chronic obstructive pulmonary disease with acute lower respiratory infection: Secondary | ICD-10-CM | POA: Diagnosis present

## 2017-10-01 DIAGNOSIS — R0602 Shortness of breath: Secondary | ICD-10-CM

## 2017-10-01 DIAGNOSIS — I10 Essential (primary) hypertension: Secondary | ICD-10-CM | POA: Diagnosis present

## 2017-10-01 DIAGNOSIS — Z993 Dependence on wheelchair: Secondary | ICD-10-CM

## 2017-10-01 DIAGNOSIS — Z881 Allergy status to other antibiotic agents status: Secondary | ICD-10-CM

## 2017-10-01 DIAGNOSIS — Z7901 Long term (current) use of anticoagulants: Secondary | ICD-10-CM

## 2017-10-01 DIAGNOSIS — J9621 Acute and chronic respiratory failure with hypoxia: Secondary | ICD-10-CM | POA: Diagnosis present

## 2017-10-01 DIAGNOSIS — N179 Acute kidney failure, unspecified: Secondary | ICD-10-CM | POA: Diagnosis present

## 2017-10-01 DIAGNOSIS — J441 Chronic obstructive pulmonary disease with (acute) exacerbation: Secondary | ICD-10-CM

## 2017-10-01 DIAGNOSIS — Z794 Long term (current) use of insulin: Secondary | ICD-10-CM

## 2017-10-01 DIAGNOSIS — J189 Pneumonia, unspecified organism: Secondary | ICD-10-CM

## 2017-10-01 DIAGNOSIS — E1122 Type 2 diabetes mellitus with diabetic chronic kidney disease: Secondary | ICD-10-CM | POA: Diagnosis present

## 2017-10-01 DIAGNOSIS — G4733 Obstructive sleep apnea (adult) (pediatric): Secondary | ICD-10-CM | POA: Diagnosis present

## 2017-10-01 DIAGNOSIS — Z8673 Personal history of transient ischemic attack (TIA), and cerebral infarction without residual deficits: Secondary | ICD-10-CM

## 2017-10-01 DIAGNOSIS — Z87891 Personal history of nicotine dependence: Secondary | ICD-10-CM

## 2017-10-01 DIAGNOSIS — W010XXA Fall on same level from slipping, tripping and stumbling without subsequent striking against object, initial encounter: Secondary | ICD-10-CM | POA: Diagnosis present

## 2017-10-01 DIAGNOSIS — I130 Hypertensive heart and chronic kidney disease with heart failure and stage 1 through stage 4 chronic kidney disease, or unspecified chronic kidney disease: Secondary | ICD-10-CM | POA: Diagnosis present

## 2017-10-01 DIAGNOSIS — Z86718 Personal history of other venous thrombosis and embolism: Secondary | ICD-10-CM

## 2017-10-01 DIAGNOSIS — Z8709 Personal history of other diseases of the respiratory system: Secondary | ICD-10-CM

## 2017-10-01 DIAGNOSIS — Z888 Allergy status to other drugs, medicaments and biological substances status: Secondary | ICD-10-CM

## 2017-10-01 DIAGNOSIS — T364X5A Adverse effect of tetracyclines, initial encounter: Secondary | ICD-10-CM | POA: Diagnosis present

## 2017-10-01 DIAGNOSIS — N183 Chronic kidney disease, stage 3 (moderate): Secondary | ICD-10-CM | POA: Diagnosis present

## 2017-10-01 DIAGNOSIS — F1490 Cocaine use, unspecified, uncomplicated: Secondary | ICD-10-CM | POA: Diagnosis present

## 2017-10-01 DIAGNOSIS — Z79899 Other long term (current) drug therapy: Secondary | ICD-10-CM

## 2017-10-01 DIAGNOSIS — Z9981 Dependence on supplemental oxygen: Secondary | ICD-10-CM

## 2017-10-01 LAB — URINALYSIS, ROUTINE W REFLEX MICROSCOPIC
BILIRUBIN URINE: NEGATIVE
GLUCOSE, UA: NEGATIVE mg/dL
HGB URINE DIPSTICK: NEGATIVE
Ketones, ur: NEGATIVE mg/dL
Leukocytes, UA: NEGATIVE
Nitrite: NEGATIVE
PH: 5 (ref 5.0–8.0)
Protein, ur: NEGATIVE mg/dL
Specific Gravity, Urine: 1.008 (ref 1.005–1.030)

## 2017-10-01 LAB — CBC WITH DIFFERENTIAL/PLATELET
Abs Immature Granulocytes: 0.1 10*3/uL (ref 0.0–0.1)
BASOS ABS: 0 10*3/uL (ref 0.0–0.1)
Basophils Relative: 0 %
Eosinophils Absolute: 0.2 10*3/uL (ref 0.0–0.7)
Eosinophils Relative: 2 %
HEMATOCRIT: 44.1 % (ref 39.0–52.0)
HEMOGLOBIN: 13.1 g/dL (ref 13.0–17.0)
IMMATURE GRANULOCYTES: 1 %
LYMPHS ABS: 1.2 10*3/uL (ref 0.7–4.0)
LYMPHS PCT: 12 %
MCH: 24.8 pg — ABNORMAL LOW (ref 26.0–34.0)
MCHC: 29.7 g/dL — ABNORMAL LOW (ref 30.0–36.0)
MCV: 83.5 fL (ref 78.0–100.0)
Monocytes Absolute: 0.9 10*3/uL (ref 0.1–1.0)
Monocytes Relative: 10 %
NEUTROS ABS: 7.4 10*3/uL (ref 1.7–7.7)
NEUTROS PCT: 75 %
Platelets: 236 10*3/uL (ref 150–400)
RBC: 5.28 MIL/uL (ref 4.22–5.81)
RDW: 21 % — ABNORMAL HIGH (ref 11.5–15.5)
WBC: 9.8 10*3/uL (ref 4.0–10.5)

## 2017-10-01 LAB — I-STAT VENOUS BLOOD GAS, ED
Acid-Base Excess: 4 mmol/L — ABNORMAL HIGH (ref 0.0–2.0)
Bicarbonate: 31.8 mmol/L — ABNORMAL HIGH (ref 20.0–28.0)
O2 Saturation: 65 %
PCO2 VEN: 57.6 mmHg (ref 44.0–60.0)
PO2 VEN: 37 mmHg (ref 32.0–45.0)
TCO2: 34 mmol/L — ABNORMAL HIGH (ref 22–32)
pH, Ven: 7.35 (ref 7.250–7.430)

## 2017-10-01 LAB — COMPREHENSIVE METABOLIC PANEL
ALBUMIN: 3.3 g/dL — AB (ref 3.5–5.0)
ALK PHOS: 98 U/L (ref 38–126)
ALT: 26 U/L (ref 17–63)
ANION GAP: 15 (ref 5–15)
AST: 29 U/L (ref 15–41)
BILIRUBIN TOTAL: 0.6 mg/dL (ref 0.3–1.2)
BUN: 100 mg/dL — AB (ref 6–20)
CALCIUM: 9.7 mg/dL (ref 8.9–10.3)
CO2: 28 mmol/L (ref 22–32)
Chloride: 96 mmol/L — ABNORMAL LOW (ref 101–111)
Creatinine, Ser: 3.57 mg/dL — ABNORMAL HIGH (ref 0.61–1.24)
GFR calc Af Amer: 19 mL/min — ABNORMAL LOW (ref >60)
GFR calc non Af Amer: 16 mL/min — ABNORMAL LOW (ref >60)
GLUCOSE: 151 mg/dL — AB (ref 65–99)
POTASSIUM: 3.7 mmol/L (ref 3.5–5.1)
SODIUM: 139 mmol/L (ref 135–145)
TOTAL PROTEIN: 7.7 g/dL (ref 6.5–8.1)

## 2017-10-01 LAB — PROTIME-INR
INR: 2.42
Prothrombin Time: 26.1 seconds — ABNORMAL HIGH (ref 11.4–15.2)

## 2017-10-01 LAB — I-STAT CG4 LACTIC ACID, ED
LACTIC ACID, VENOUS: 2.3 mmol/L — AB (ref 0.5–1.9)
Lactic Acid, Venous: 5.02 mmol/L (ref 0.5–1.9)

## 2017-10-01 LAB — HEMOGLOBIN A1C
HEMOGLOBIN A1C: 8.9 % — AB (ref 4.8–5.6)
Mean Plasma Glucose: 208.73 mg/dL

## 2017-10-01 LAB — CBG MONITORING, ED: Glucose-Capillary: 203 mg/dL — ABNORMAL HIGH (ref 65–99)

## 2017-10-01 LAB — BRAIN NATRIURETIC PEPTIDE: B Natriuretic Peptide: 87.9 pg/mL (ref 0.0–100.0)

## 2017-10-01 LAB — TROPONIN I: Troponin I: <0.03 ng/mL (ref <0.03)

## 2017-10-01 LAB — PROCALCITONIN: PROCALCITONIN: 0.27 ng/mL

## 2017-10-01 MED ORDER — TERAZOSIN HCL 2 MG PO CAPS
4.0000 mg | ORAL_CAPSULE | Freq: Every day | ORAL | Status: DC
  Administered 2017-10-01 – 2017-10-03 (×3): 4 mg via ORAL
  Filled 2017-10-01 (×4): qty 2

## 2017-10-01 MED ORDER — INSULIN GLARGINE 100 UNIT/ML ~~LOC~~ SOLN
40.0000 [IU] | Freq: Two times a day (BID) | SUBCUTANEOUS | Status: DC
  Administered 2017-10-01 – 2017-10-02 (×2): 40 [IU] via SUBCUTANEOUS
  Filled 2017-10-01 (×3): qty 0.4

## 2017-10-01 MED ORDER — WARFARIN - PHARMACIST DOSING INPATIENT
Freq: Every day | Status: DC
  Administered 2017-10-02: 18:00:00

## 2017-10-01 MED ORDER — IPRATROPIUM BROMIDE 0.02 % IN SOLN
1.0000 mg | Freq: Once | RESPIRATORY_TRACT | Status: AC
  Administered 2017-10-01: 1 mg via RESPIRATORY_TRACT
  Filled 2017-10-01: qty 5

## 2017-10-01 MED ORDER — PREDNISONE 50 MG PO TABS
60.0000 mg | ORAL_TABLET | Freq: Every day | ORAL | Status: DC
  Administered 2017-10-02 – 2017-10-04 (×3): 60 mg via ORAL
  Filled 2017-10-01 (×3): qty 1

## 2017-10-01 MED ORDER — DOXYCYCLINE HYCLATE 100 MG IV SOLR
100.0000 mg | Freq: Once | INTRAVENOUS | Status: AC
  Administered 2017-10-01: 100 mg via INTRAVENOUS
  Filled 2017-10-01: qty 100

## 2017-10-01 MED ORDER — ALLOPURINOL 100 MG PO TABS
200.0000 mg | ORAL_TABLET | Freq: Every day | ORAL | Status: DC
  Administered 2017-10-02 – 2017-10-04 (×3): 200 mg via ORAL
  Filled 2017-10-01 (×3): qty 2

## 2017-10-01 MED ORDER — SODIUM CHLORIDE 0.9 % IV BOLUS
1000.0000 mL | Freq: Once | INTRAVENOUS | Status: AC
  Administered 2017-10-01: 1000 mL via INTRAVENOUS

## 2017-10-01 MED ORDER — OXYCODONE-ACETAMINOPHEN 5-325 MG PO TABS
1.0000 | ORAL_TABLET | Freq: Four times a day (QID) | ORAL | Status: DC | PRN
  Administered 2017-10-02 – 2017-10-03 (×3): 1 via ORAL
  Filled 2017-10-01 (×3): qty 1

## 2017-10-01 MED ORDER — ACETAMINOPHEN 325 MG PO TABS
650.0000 mg | ORAL_TABLET | Freq: Four times a day (QID) | ORAL | Status: DC | PRN
  Administered 2017-10-01: 650 mg via ORAL
  Filled 2017-10-01: qty 2

## 2017-10-01 MED ORDER — SODIUM CHLORIDE 0.9 % IV BOLUS
1000.0000 mL | Freq: Once | INTRAVENOUS | Status: AC
Start: 2017-10-01 — End: 2017-10-01
  Administered 2017-10-01: 1000 mL via INTRAVENOUS

## 2017-10-01 MED ORDER — ONDANSETRON HCL 4 MG PO TABS: 4.0000 mg | ORAL_TABLET | Freq: Four times a day (QID) | ORAL | Status: DC | PRN

## 2017-10-01 MED ORDER — ALBUTEROL (5 MG/ML) CONTINUOUS INHALATION SOLN
10.0000 mg/h | INHALATION_SOLUTION | Freq: Once | RESPIRATORY_TRACT | Status: AC
  Administered 2017-10-01: 10 mg/h via RESPIRATORY_TRACT
  Filled 2017-10-01: qty 20

## 2017-10-01 MED ORDER — METOPROLOL TARTRATE 25 MG PO TABS: 50.0000 mg | ORAL_TABLET | Freq: Two times a day (BID) | ORAL | Status: DC

## 2017-10-01 MED ORDER — DOXYCYCLINE HYCLATE 100 MG PO TABS
100.0000 mg | ORAL_TABLET | Freq: Two times a day (BID) | ORAL | Status: DC
  Administered 2017-10-02 (×2): 100 mg via ORAL
  Filled 2017-10-01 (×3): qty 1

## 2017-10-01 MED ORDER — WARFARIN SODIUM 2.5 MG PO TABS
2.5000 mg | ORAL_TABLET | ORAL | Status: DC
  Administered 2017-10-01: 2.5 mg via ORAL
  Filled 2017-10-01: qty 1

## 2017-10-01 MED ORDER — INSULIN ASPART 100 UNIT/ML ~~LOC~~ SOLN
0.0000 [IU] | Freq: Every day | SUBCUTANEOUS | Status: DC
  Administered 2017-10-01: 2 [IU] via SUBCUTANEOUS
  Administered 2017-10-02 – 2017-10-03 (×2): 5 [IU] via SUBCUTANEOUS
  Filled 2017-10-01: qty 1

## 2017-10-01 MED ORDER — INSULIN ASPART 100 UNIT/ML ~~LOC~~ SOLN
0.0000 [IU] | Freq: Three times a day (TID) | SUBCUTANEOUS | Status: DC
  Administered 2017-10-02 (×2): 20 [IU] via SUBCUTANEOUS
  Administered 2017-10-02 – 2017-10-03 (×3): 15 [IU] via SUBCUTANEOUS
  Administered 2017-10-03: 11 [IU] via SUBCUTANEOUS
  Administered 2017-10-04: 15 [IU] via SUBCUTANEOUS
  Administered 2017-10-04: 11 [IU] via SUBCUTANEOUS

## 2017-10-01 MED ORDER — IPRATROPIUM-ALBUTEROL 0.5-2.5 (3) MG/3ML IN SOLN
3.0000 mL | Freq: Four times a day (QID) | RESPIRATORY_TRACT | Status: DC
  Administered 2017-10-01 – 2017-10-03 (×4): 3 mL via RESPIRATORY_TRACT
  Filled 2017-10-01 (×7): qty 3

## 2017-10-01 MED ORDER — METHYLPREDNISOLONE SODIUM SUCC 125 MG IJ SOLR
125.0000 mg | Freq: Once | INTRAMUSCULAR | Status: AC
  Administered 2017-10-01: 125 mg via INTRAVENOUS
  Filled 2017-10-01: qty 2

## 2017-10-01 MED ORDER — SODIUM CHLORIDE 0.9 % IV BOLUS: 500.0000 mL | Freq: Once | INTRAVENOUS | Status: DC

## 2017-10-01 MED ORDER — POLYETHYLENE GLYCOL 3350 17 G PO PACK: 17.0000 g | PACK | Freq: Every day | ORAL | Status: DC | PRN

## 2017-10-01 MED ORDER — PREDNISONE 20 MG PO TABS: 40.0000 mg | ORAL_TABLET | Freq: Every day | ORAL | Status: DC

## 2017-10-01 MED ORDER — WARFARIN SODIUM 5 MG PO TABS: 5.0000 mg | ORAL_TABLET | ORAL | Status: DC

## 2017-10-01 MED ORDER — PANTOPRAZOLE SODIUM 40 MG PO TBEC
40.0000 mg | DELAYED_RELEASE_TABLET | Freq: Every day | ORAL | Status: DC
  Administered 2017-10-02 – 2017-10-04 (×3): 40 mg via ORAL
  Filled 2017-10-01 (×3): qty 1

## 2017-10-01 MED ORDER — SODIUM CHLORIDE 0.9 % IV SOLN
1.0000 g | INTRAVENOUS | Status: DC
  Administered 2017-10-02: 1 g via INTRAVENOUS
  Filled 2017-10-01: qty 10

## 2017-10-01 MED ORDER — METOPROLOL TARTRATE 50 MG PO TABS
50.0000 mg | ORAL_TABLET | Freq: Two times a day (BID) | ORAL | Status: DC
  Administered 2017-10-02 – 2017-10-04 (×5): 50 mg via ORAL
  Filled 2017-10-01 (×3): qty 1
  Filled 2017-10-01: qty 2
  Filled 2017-10-01 (×2): qty 1

## 2017-10-01 MED ORDER — ONDANSETRON HCL 4 MG/2ML IJ SOLN: 4.0000 mg | Freq: Four times a day (QID) | INTRAMUSCULAR | Status: DC | PRN

## 2017-10-01 MED ORDER — ATORVASTATIN CALCIUM 80 MG PO TABS
80.0000 mg | ORAL_TABLET | Freq: Every day | ORAL | Status: DC
  Administered 2017-10-01 – 2017-10-03 (×3): 80 mg via ORAL
  Filled 2017-10-01 (×3): qty 1

## 2017-10-01 MED ORDER — ISOSORBIDE MONONITRATE ER 30 MG PO TB24: 30.0000 mg | ORAL_TABLET | Freq: Every day | ORAL | Status: DC

## 2017-10-01 MED ORDER — MOMETASONE FURO-FORMOTEROL FUM 200-5 MCG/ACT IN AERO
2.0000 | INHALATION_SPRAY | Freq: Two times a day (BID) | RESPIRATORY_TRACT | Status: DC
  Administered 2017-10-02 – 2017-10-04 (×4): 2 via RESPIRATORY_TRACT
  Filled 2017-10-01: qty 8.8

## 2017-10-01 MED ORDER — SPIRONOLACTONE 25 MG PO TABS
25.0000 mg | ORAL_TABLET | Freq: Every day | ORAL | Status: DC
  Administered 2017-10-02 – 2017-10-04 (×3): 25 mg via ORAL
  Filled 2017-10-01 (×3): qty 1

## 2017-10-01 MED ORDER — LEVOFLOXACIN IN D5W 750 MG/150ML IV SOLN
750.0000 mg | Freq: Once | INTRAVENOUS | Status: DC
  Filled 2017-10-01: qty 150

## 2017-10-01 MED ORDER — SODIUM CHLORIDE 0.9 % IV SOLN
2.0000 g | Freq: Once | INTRAVENOUS | Status: AC
  Administered 2017-10-01: 2 g via INTRAVENOUS
  Filled 2017-10-01: qty 20

## 2017-10-01 MED ORDER — SERTRALINE HCL 100 MG PO TABS
100.0000 mg | ORAL_TABLET | Freq: Every day | ORAL | Status: DC
  Administered 2017-10-02 – 2017-10-04 (×3): 100 mg via ORAL
  Filled 2017-10-01 (×3): qty 1

## 2017-10-01 MED ORDER — ACETAMINOPHEN 650 MG RE SUPP: 650.0000 mg | Freq: Four times a day (QID) | RECTAL | Status: DC | PRN

## 2017-10-01 NOTE — Progress Notes (Signed)
ANTICOAGULATION CONSULT NOTE - Initial Consult  Pharmacy Consult for warfarin Indication: atrial fibrillation  Allergies  Allergen Reactions  . Citalopram Other (See Comments)    Unknown reaction  . Lisinopril Other (See Comments)    Unknown reaction  . Vancomycin Other (See Comments)    Unknown reaction    Patient Measurements:   Heparin Dosing Weight: n/a   Vital Signs: Temp: 97.7 F (36.5 C) (05/20 1516) Temp Source: Oral (05/20 1516) BP: 133/63 (05/20 1800) Pulse Rate: 86 (05/20 1800)  Labs: Recent Labs    10/01/17 1459  HGB 13.1  HCT 44.1  PLT 236  LABPROT 26.1*  INR 2.42  CREATININE 3.57*  TROPONINI <0.03    CrCl cannot be calculated (Unknown ideal weight.).   Medical History: Past Medical History:  Diagnosis Date  . Chronic a-fib (HCC)    a. rate-controlled and on coumadin  . Chronic diastolic CHF (congestive heart failure) (HCC)    a. EF 55%  . Chronic kidney disease, stage III (moderate) (HCC)   . Diabetes mellitus with neuropathy (HCC)   . History of COPD    a. on home O2  . History of CVA (cerebrovascular accident)   . History of DVT (deep vein thrombosis)    a. chronic coumadin  . History of substance abuse   . History of TIA (transient ischemic attack)   . Hyperlipidemia   . Hypertension   . Morbid obesity (HCC)   . Obstructive sleep apnea    a. On CPAP.  Marland Kitchen Pulmonary hypertension (HCC)     Medications:   (Not in a hospital admission)  Assessment: 89 YOM with a history of Afib on warfarin here with shortness of breath. Pharmacy consulted to resume home warfarin. INR on admission is therapeutic at 2.42. His last dose of warfarin was yesterday. Patient has been started on doxycycline which can interact with warfarin  Home warfarin regimen: 2.5 mg on M/W/F; 5 mg on all other days   Goal of Therapy:  INR 2-3 Monitor platelets by anticoagulation protocol: Yes   Plan:  -Resume home warfarin regimen. First dose today -Monitor daily  PT/INR    Vinnie Level, PharmD., BCPS Clinical Pharmacist Clinical phone for 10/01/17 until 11pm: [phone] If after 11pm, please call main pharmacy at: [phone]

## 2017-10-01 NOTE — ED Notes (Addendum)
Unable to obtain blood cultures successfully.

## 2017-10-01 NOTE — H&P (Signed)
Date: 10/01/2017               Patient Name:  Seth Dudley MRN: [id number]  DOB: 07-Apr-1951 Age / Sex: 67 y.o., male   [facility]         Medical Service: Internal Medicine Teaching Service         Attending Physician: Dr. Rebeca Alert, Raynaldo Opitz, MD    First Contact: Jefferey Pica Pager: [phone]  Second Contact: Dr. Lorella Nimrod Pager: [phone]       After Hours (After 5p/  First Contact Pager: [phone]  weekends / holidays): Second Contact Pager: [phone]   Chief Complaint: cough, SOB, increased O2 requirement  History of Present Illness: Seth Dudley is a 48 yoM ex-smoker with a PMH of CHF, COPD requiring home oxygen, morbid obesity, HTN, DM c/b neuropathy, HLD, CKD-3, OSA requiring CPAP, CVA, pulmonary HTN, chronic AF on warfarin who presented to the ED for worsening cough, dyspnea on exertion and generalized weakness.  Seth Dudley was in his usual state of health until Seth Dudley began having increased oxygen requirement 3 weeks ago. Seth Dudley typically uses 1L at rest and 3L during exertion, but was unable to decrease below 2L for the past two weeks without desaturating into the high 70s. This change was accompanied by a cough. Seth Dudley reports that Seth Dudley has been coughing all through the night, for spells that last up to 30 minutes and wake him from sleep. The cough is productive of brownish phlegm and Seth Dudley denies seeing any streaks of blood. Notably, Seth Dudley was prescribed antibiotics for this by his home health physician but never filled the script. The iatrogenic stimulus for Seth Dudley was a fall earlier today while Seth Dudley was trying to climb into his [employer]. Seth Dudley states that Seth Dudley tried to climb the step when his left leg gave way and Seth Dudley "went down." Seth Dudley tried again, but was even less successful than his prior attempt. Seth Dudley notes congestion, particularly in his left nostril for the last two weeks. Seth Dudley endorses sick contacts in his grandchildren. Seth Dudley experiences some chest pain and dyspnea with exertion at  baseline.  Seth Dudley has sleep apnea for which Seth Dudley wears a CPAP nightly. Chart review reveals the conditions listed above as well as chronic DVT, which Seth Dudley denies. Seth Dudley knows Seth Dudley has heart failure but is unsure of his ejection fraction (last listed as diastolic CHF with EF 38% in 2014). Seth Dudley generally carries his fluid in his legs, lungs, stomach and around his heart. Seth Dudley weighs himself daily but is unsure of his dry weight, though Seth Dudley believes it to be around 420 lbs. Seth Dudley states that Seth Dudley was 422 lbs. yesterday and 424 lbs. this morning. Seth Dudley states that his last echocardiogram was a long time ago. Seth Dudley feels that his blood sugars have been under good control and less than or near 200 for the last month. Seth Dudley reports a bad left kidney.  At home Seth Dudley is assisted by [facility] through the [employer]. His team consists of wound care, social work, dietician, a doctor and a pharmacist. Seth Dudley reports leg sores/edema and soreness in his pilonidal area which is being tended to by wound care.  Meds:  Current Meds  Medication Sig  . albuterol (PROVENTIL HFA;VENTOLIN HFA) 108 (90 Base) MCG/ACT inhaler Inhale 2 puffs into the lungs every 4 (four) hours as needed for wheezing or shortness of breath.  Marland Kitchen albuterol (PROVENTIL) (2.5 MG/3ML) 0.083% nebulizer solution Take 2.5 mg by nebulization every 6 (  six) hours as needed for wheezing or shortness of breath.  . allopurinol (ZYLOPRIM) 100 MG tablet Take 200 mg by mouth daily. For gout  . atorvastatin (LIPITOR) 80 MG tablet Take 80 mg by mouth at bedtime. For cholesterol  . budesonide-formoterol (SYMBICORT) 160-4.5 MCG/ACT inhaler Inhale 2 puffs into the lungs 2 (two) times daily.  . cholecalciferol (VITAMIN D) 1000 UNITS tablet Take 3,000 Units by mouth daily.  . clotrimazole (LOTRIMIN) 1 % cream Apply 1 application topically 2 (two) times daily as needed (skin irritation - back and legs).   . DEXTROSE, DIABETIC USE, PO Take 15 g by mouth See admin instructions. Use 1 tube  (15 grams of glucose) by mouth as directed for low blood sugar with symptoms  . fluticasone (FLONASE) 50 MCG/ACT nasal spray Place 2 sprays into both nostrils daily as needed for allergies or rhinitis (congestion).  . insulin glargine (LANTUS) 100 UNIT/ML injection Inject 70 Units into the skin 2 (two) times daily.   . insulin regular (NOVOLIN R,HUMULIN R) 100 units/mL injection Inject 60 Units into the skin 3 (three) times daily before meals.  . isosorbide mononitrate (IMDUR) 30 MG 24 hr tablet Take 30 mg by mouth daily.  . magnesium oxide (MAG-OX) 400 MG tablet Take 400 mg by mouth daily.  . metolazone (ZAROXOLYN) 2.5 MG tablet Take 2.5 mg by mouth See admin instructions. Take 1 tablet (2.5 mg) by mouth daily for 3 days for weight gain of more then 3 lbs/day or 5 lbs/week  . metoprolol tartrate (LOPRESSOR) 50 MG tablet Take 50 mg by mouth 2 (two) times daily.  . miconazole (MICOTIN) 2 % powder Apply topically 2 (two) times daily. Apply to areas of fungal rash including skin folds under arms and groin  . omeprazole (PRILOSEC) 20 MG capsule Take 20 mg by mouth daily before breakfast. Take 30 minutes before meal  . oxyCODONE-acetaminophen (PERCOCET/ROXICET) 5-325 MG tablet Take 1 tablet by mouth 4 (four) times daily as needed (pain).  . polyethylene glycol (MIRALAX / GLYCOLAX) packet Take 17 g by mouth daily as needed (constipation).  . potassium chloride SA (K-DUR,KLOR-CON) 20 MEQ tablet Take 20 mEq by mouth 2 (two) times daily.  . sennosides-docusate sodium (SENOKOT-S) 8.6-50 MG tablet Take 3 tablets by mouth 2 (two) times daily.   . sertraline (ZOLOFT) 100 MG tablet Take 100 mg by mouth daily.  . Skin Protectants, Misc. (ALOE VESTA PROTECTIVE) OINT Apply 1 application topically 2 (two) times daily. For protection of coccyx  . sodium chloride (OCEAN) 0.65 % SOLN nasal spray Place 2 sprays into both nostrils 5 (five) times daily as needed for congestion.  Marland Kitchen spironolactone (ALDACTONE) 25 MG tablet  Take 25 mg by mouth daily.  Marland Kitchen terazosin (HYTRIN) 2 MG capsule Take 4 mg by mouth at bedtime.  Marland Kitchen tolnaftate (TINACTIN) 1 % powder Apply 1 application topically daily.  Marland Kitchen torsemide (DEMADEX) 20 MG tablet Take 40-60 mg by mouth See admin instructions. Take 3 tablets (60 mg) by mouth every morning and 2 tablets (40 mg) every night - for congestive heart failure  . triamcinolone cream (KENALOG) 0.1 % Apply 1 application topically See admin instructions. Add 1 teaspoonful to liberal amount of eucerin cream and apply to lower legs before wrapping twice weekly  . warfarin (COUMADIN) 5 MG tablet Take 2.5-5 mg by mouth See admin instructions. Take 1/2 tablet (2.5 mg) by mouth Monday, Wednesday, Friday at 6pm, take 1 tablet (5 mg) Sunday, Tuesday, Thursday, Saturday at 6pm  Allergies: Allergies as of 10/01/2017 - Review Complete 10/01/2017  Allergen Reaction Noted  . Citalopram Other (See Comments) 06/10/2012  . Lisinopril Other (See Comments) 06/10/2012  . Vancomycin Other (See Comments) 06/10/2012   Past Medical History:  Diagnosis Date  . Chronic a-fib (HCC)    a. rate-controlled and on coumadin  . Chronic diastolic CHF (congestive heart failure) (HCC)    a. EF 55%  . Chronic kidney disease, stage III (moderate) (HCC)   . Diabetes mellitus with neuropathy ([place])   . History of COPD    a. on home O2  . History of CVA (cerebrovascular accident)   . History of DVT (deep vein thrombosis)    a. chronic coumadin  . History of substance abuse   . History of TIA (transient ischemic attack)   . Hyperlipidemia   . Hypertension   . Morbid obesity ([place])   . Obstructive sleep apnea    a. On CPAP.  Marland Kitchen Pulmonary hypertension (HCC)     Family History:  Family History  Problem Relation Age of Onset  . Heart attack Father   . Hyperlipidemia Brother   . Hypertension Brother     Social History:  Social History   Tobacco Use  . Smoking status: Former Smoker    Packs/day: 2.50    Years: 43.00     Pack years: 107.50    Types: Cigarettes    Last attempt to quit: 06/19/2011    Years since quitting: 6.2  Substance Use Topics  . Alcohol use: Not Currently    Comment: Drank two 5ths a day. Quit 3-6 yrs before 2019  . Drug use: Not Currently    Types: "Crack" cocaine    Comment: Patient did crack cocaine for 10 years, stopped 2011    Review of Systems: A complete ROS was negative except as per HPI.  Physical Exam: Blood pressure (!) 112/55, pulse 86, temperature 97.7 F (36.5 C), temperature source Oral, resp. rate 17, SpO2 93 %.  Physical Exam  Constitutional: Seth Dudley is oriented to person, place, and time. Seth Dudley appears ill (intermittently shaking entire body).  HENT:  Head: Normocephalic and atraumatic.  Eyes: Left eye exhibits abnormal extraocular motion.  Left palpebral droop  Neck: Normal range of motion. JVD: not visualized, potentially due to habitus.  Cardiovascular: Normal rate and regular rhythm.  Exam limited by body habitus  Pulmonary/Chest: Seth Dudley has wheezes. Seth Dudley has rhonchi.  Exam limited by body habitus  Abdominal: Soft. Normal appearance and bowel sounds are normal. Distention: obese abdomen.  Midline well-healed surgical incision  Musculoskeletal:       Right lower leg: Seth Dudley exhibits tenderness and edema (wrapped by WOCN).       Left lower leg: Seth Dudley exhibits tenderness and edema (wrapped by WOCN).       Left foot: There is decreased range of motion (unable to dorsiflex or plantar flex).  Neurological: Seth Dudley is alert and oriented to person, place, and time.  Unable to lift LLE against gravity.  Skin: Skin is warm and dry.  Psychiatric: Seth Dudley has a normal mood and affect. His behavior is normal.   EKG: personally reviewed my interpretation is sinus rhythm with a QTc of 469.  CXR: personally reviewed my interpretation is diffuse interstitial densities bilaterally.  Lab Results  Component Value Date   WBC 9.8 10/01/2017   HGB 13.1 10/01/2017   HCT 44.1 10/01/2017   PLT  236 10/01/2017   GLUCOSE 151 (H) 10/01/2017   ALT 26 10/01/2017  AST 29 10/01/2017   NA 139 10/01/2017   K 3.7 10/01/2017   CL 96 (L) 10/01/2017   CREATININE 3.57 (H) 10/01/2017   BUN 100 (H) 10/01/2017   CO2 28 10/01/2017   INR 2.42 10/01/2017   Lactic Acid, Venous    Component Value Date/Time   LATICACIDVEN 2.30 (HH) 10/01/2017 1858   Lactic Acid, Venous    Component Value Date/Time   LATICACIDVEN 5.02 (HH) 10/01/2017 1539   Lab Results  Component Value Date   INR 2.42 10/01/2017   INR 2.7 06/04/2012   INR 3.4 06/03/2012   Procalcitonin WNL  Assessment & Plan by Problem: Principal Problem:   CAP (community acquired pneumonia) Active Problems:   Diabetes mellitus with neuropathy (HCC)   Hypertension   Obstructive sleep apnea   Chronic diastolic CHF (congestive heart failure) (HCC)   History of COPD  #CAP, COPD Exacerbation: Positive sick contacts with increased sputum production, oxygen requirement, and shortness of breath consistent with community acquired pneumonia and COPD. Seth Dudley remains afebrile and does not have an elevated white blood count. Seth Dudley was started on doxycycline and ceftriaxone in the ED. Elevated Lactic Acid of 5.1 on admission, met requirements for code sepsis. - Repeat CXR - 2/p 120m IV solumedrol - 620mpo prednisone, ceftriaxone 2g - Doxycycline 10078mo BID - DuoNebs, albuterol, ipratropium - Dulera 2 puffs BID  #DM with neuropathy: Home regimen per patient of 60u regular mealtime (breakfast, 6 PM and 9 PM) and 70u Lantus BID. SSI initiated with 40u Lantus BID. Wound care ordered. - Urinalysis pending - Percocet 5-325m50mh prn for pain  #Cardiac (CHF, HTN, Afib): EKG with no acute changes. Continue home warfarin, aldactone, metoprolol, high dose atorvastatin, Imdur. Extremities remain edematous bilaterally. S/p 2L IVF. - Daily PT-INR, pharmacy to see - Echocardiogram ordered  #OSA: Wears CPAP nightly. RT to see.  #s/p CVA: Generalized  weakness bilaterally with marked inability to raise or dorsiflex left extremity. Unclear if this is residual deficit or new problem. Patient is wheelchair bound at baseline. - PT to see  #Chronic Medical Conditions: Continue home zoloft, allopurinol, terazosin, protonix.  #PPX: PEG prn, Zofran prn, on warfarin  Dispo: Admit patient to Observation with expected length of stay less than 2 midnights.  Signed: PoweLaureen Ochsdical Student 10/01/2017, 7:42 PM  Pager: [phone] for Student Documentation:  I personally was present and performed or re-performed the history, physical exam and medical decision-making activities of this service and have verified that the service and findings are accurately documented in the student's note.  AminLorella Nimrod 10/01/2017, 7:58 PM

## 2017-10-01 NOTE — ED Triage Notes (Addendum)
Patient was getting into a van from his wheelchair when his legs gave out and he was too weak to get back into his wheelchair. Patient was dyspneic on scene and lethargic. Patient arrives on 6L [place]. Alert and oriented. Obese and generally edematous.

## 2017-10-01 NOTE — ED Notes (Signed)
Dark Green top on rocker in [employer].

## 2017-10-01 NOTE — ED Notes (Signed)
Paged doctor for lopressor parameters

## 2017-10-01 NOTE — ED Provider Notes (Signed)
[facility] EMERGENCY DEPARTMENT Provider Note   CSN: [id number] Arrival date & time: 10/01/17  1459     History   Chief Complaint Chief Complaint  Patient presents with  . Shortness of Breath    HPI Seth Dudley is a 67 y.o. male.  HPI   67 yo M with h/o AFib, CHF, CKD, COPD on 1-3L home O2 here with SOB. Pt states that he was out to get a new pair of shoes today. He is normally able to stand up and g et into his car. However, he states he's been weak x 1 week with increased cough and sputum production, as well as SOB. Pt reports that he was told he had an infection last week, was put on ABX but has not filled them. He reports he tripped getting in and fell forward, was unable to get himself off his knees. Denies any MSK pain currently. He has a mild HA but is adamant he did not hit his head when falling forward. No weakness or numbness. No CP. He endorses increased cough, wheezing, and SOB. No SOB. No nausea or vomiting. Sx worse with any amount of exertion.  Past Medical History:  Diagnosis Date  . Chronic a-fib (HCC)    a. rate-controlled and on coumadin  . Chronic diastolic CHF (congestive heart failure) (HCC)    a. EF 55%  . Chronic kidney disease, stage III (moderate) (HCC)   . Diabetes mellitus with neuropathy (HCC)   . History of COPD    a. on home O2  . History of CVA (cerebrovascular accident)   . History of DVT (deep vein thrombosis)    a. chronic coumadin  . History of substance abuse   . History of TIA (transient ischemic attack)   . Hyperlipidemia   . Hypertension   . Morbid obesity (HCC)   . Obstructive sleep apnea    a. On CPAP.  Marland Kitchen Pulmonary hypertension [facility])     Patient Active Problem List   Diagnosis Date Noted  . History of TIA (transient ischemic attack)   . History of CVA (cerebrovascular accident)   . Diabetes mellitus with neuropathy (HCC)   . Morbid obesity (HCC)   . Hypertension   . Obstructive sleep apnea   .  Chronic diastolic CHF (congestive heart failure) (HCC)   . Pulmonary hypertension (HCC)   . Chronic a-fib (HCC)   . History of DVT (deep vein thrombosis)   . History of COPD   . History of substance abuse   . Hyperlipidemia   . Chronic kidney disease, stage III (moderate) (HCC)     No past surgical history on file.      Home Medications    Prior to Admission medications   Medication Sig Start Date End Date Taking? Authorizing Provider  allopurinol (ZYLOPRIM) 100 MG tablet Take 200 mg by mouth daily.    [provider]  amiodarone (PACERONE) 200 MG tablet Take 200 mg by mouth 2 (two) times daily.    [provider]  atorvastatin (LIPITOR) 80 MG tablet Take 80 mg by mouth daily.    [provider]  budesonide-formoterol (SYMBICORT) 160-4.5 MCG/ACT inhaler Inhale 2 puffs into the lungs 2 (two) times daily.    [provider]  cholecalciferol (VITAMIN D) 1000 UNITS tablet Take 3,000 Units by mouth daily.    [provider]  digoxin (LANOXIN) 0.125 MG tablet Take 0.125 mg by mouth daily.    [provider]  diltiazem (TIAZAC) 360 MG 24 hr capsule Take 360 mg by mouth daily.    [provider]  docusate sodium (COLACE) 100 MG capsule Take 200 mg by mouth 2 (two) times daily.    [provider]  gabapentin (NEURONTIN) 300 MG capsule Take 300 mg by mouth 3 (three) times daily.    [provider]  insulin aspart protamine-insulin aspart (NOVOLOG 70/30) (70-30) 100 UNIT/ML injection Inject into the skin as directed.    [provider]  insulin glargine (LANTUS) 100 UNIT/ML injection Inject 55 Units into the skin 2 (two) times daily.    [provider]  Ipratropium-Albuterol (COMBIVENT) 20-100 MCG/ACT AERS respimat Inhale 2 puffs into the lungs daily.    [provider]  isosorbide dinitrate (ISORDIL) 30 MG tablet Take 30 mg by mouth daily.    [provider]  metoprolol  (LOPRESSOR) 100 MG tablet Take 1 tablet (100 mg total) by mouth 2 (two) times daily. 06/14/12   Creig Hines, NP  nicotine (NICODERM CQ - DOSED IN MG/24 HOURS) 21 mg/24hr patch Place 1 patch onto the skin daily.    [provider]  NON FORMULARY Caps aicin 0.025% topical cream two times daily.    [provider]  omeprazole (PRILOSEC) 20 MG capsule Take 20 mg by mouth 2 (two) times daily.    [provider]  sennosides-docusate sodium (SENOKOT-S) 8.6-50 MG tablet Take 2 tablets by mouth 2 (two) times daily.    [provider]  torsemide (DEMADEX) 20 MG tablet Take 40 mg by mouth daily.     [provider]  warfarin (COUMADIN) 5 MG tablet Take 5 mg by mouth daily.    [provider]    Family History Family History  Problem Relation Age of Onset  . Heart attack Father   . Hyperlipidemia Brother   . Hypertension Brother     Social History Social History   Tobacco Use  . Smoking status: Former Smoker    Years: 35.00    Types: Cigarettes    Last attempt to quit: 06/03/2012    Years since quitting: 5.3  Substance Use Topics  . Alcohol use: Yes    Comment: Drank two 5ths a day. Quit for two years.  . Drug use: Yes    Types: "Crack" cocaine    Comment: Patient did crack cocaine for 10 years.     Allergies   Citalopram; Lisinopril; and Vancomycin   Review of Systems Review of Systems  Constitutional: Positive for fatigue.  Respiratory: Positive for cough, shortness of breath and wheezing.   Neurological: Positive for headaches.  All other systems reviewed and are negative.    Physical Exam Updated Vital Signs BP 137/64   Pulse 81   Temp 97.7 F (36.5 C) (Oral)   Resp 19   SpO2 93%   Physical Exam  Constitutional: He is oriented to person, place, and time. He appears well-developed and well-nourished. No distress.  Morbidly obese  HENT:  Head: Normocephalic and atraumatic.  Eyes: Conjunctivae are  normal.  Neck: Neck supple.  Cardiovascular: Normal rate, regular rhythm and normal heart sounds. Exam reveals no friction rub.  No murmur heard. Pulmonary/Chest: Effort normal. No respiratory distress. He has decreased breath sounds. He has wheezes in the right upper field, the right middle field, the right lower field, the left upper field, the left middle field and the left lower field. He has no rales.  Abdominal: He exhibits no distension.  Musculoskeletal:  Right lower leg: He exhibits edema (1+ pitting).       Left lower leg: He exhibits edema (1+ pitting).  Neurological: He is alert and oriented to person, place, and time. He exhibits normal muscle tone.  Skin: Skin is warm. Capillary refill takes less than 2 seconds.  Psychiatric: He has a normal mood and affect.  Nursing note and vitals reviewed.    ED Treatments / Results  Labs (all labs ordered are listed, but only abnormal results are displayed) Labs Reviewed  CBC WITH DIFFERENTIAL/PLATELET - Abnormal; Notable for the following components:      Result Value   MCH 24.8 (*)    MCHC 29.7 (*)    RDW 21.0 (*)    All other components within normal limits  COMPREHENSIVE METABOLIC PANEL - Abnormal; Notable for the following components:   Chloride 96 (*)    Glucose, Bld 151 (*)    BUN 100 (*)    Creatinine, Ser 3.57 (*)    Albumin 3.3 (*)    GFR calc non Af Amer 16 (*)    GFR calc Af Amer 19 (*)    All other components within normal limits  PROTIME-INR - Abnormal; Notable for the following components:   Prothrombin Time 26.1 (*)    All other components within normal limits  I-STAT VENOUS BLOOD GAS, ED - Abnormal; Notable for the following components:   Bicarbonate 31.8 (*)    TCO2 34 (*)    Acid-Base Excess 4.0 (*)    All other components within normal limits  I-STAT CG4 LACTIC ACID, ED - Abnormal; Notable for the following components:   Lactic Acid, Venous 5.02 (*)    All other components within normal limits    CULTURE, BLOOD (ROUTINE X 2)  CULTURE, BLOOD (ROUTINE X 2)  BRAIN NATRIURETIC PEPTIDE  TROPONIN I  PROCALCITONIN  URINALYSIS, ROUTINE W REFLEX MICROSCOPIC  I-STAT CG4 LACTIC ACID, ED    EKG EKG Interpretation  Date/Time:  Monday Oct 01 2017 15:31:40 EDT Ventricular Rate:  79 PR Interval:    QRS Duration: 98 QT Interval:  409 QTC Calculation: 469 R Axis:   -80 Text Interpretation:  Sinus rhythm Inferior infarct, old Consider anterior infarct Since last EKG, rate has decreased ST-t changes have resolved largely Confirmed by Shaune Pollack [phone]) on 10/01/2017 3:46:21 PM   Radiology Dg Chest Portable 1 View  Result Date: 10/01/2017 CLINICAL DATA:  Shortness of breath. EXAM: PORTABLE CHEST 1 VIEW COMPARISON:  None. FINDINGS: The heart size and mediastinal contours are within normal limits. Atherosclerosis of thoracic aorta is noted. No pneumothorax or pleural effusion is noted. Mild diffuse interstitial densities are noted throughout both lungs which may represent edema, inflammation or possibly scarring. The visualized skeletal structures are unremarkable. IMPRESSION: Mild diffuse interstitial densities noted throughout both lungs which may represent edema, inflammation or possibly scarring. Aortic Atherosclerosis (ICD10-I70.0). Electronically Signed   By: Lupita Raider, M.D.   On: 10/01/2017 15:13    Procedures .Critical Care Performed by: Shaune Pollack, MD Authorized by: Shaune Pollack, MD   Critical care provider statement:    Critical care time (minutes):  45   Critical care time was exclusive of:  Separately billable procedures and treating other patients and teaching time   Critical care was necessary to treat or prevent imminent or life-threatening deterioration of the following conditions:  Circulatory failure, respiratory failure and cardiac failure   Critical care was time spent personally by me on the following activities:  Development  of treatment plan with  patient or surrogate, discussions with consultants, evaluation of patient's response to treatment, examination of patient, obtaining history from patient or surrogate, ordering and performing treatments and interventions, ordering and review of laboratory studies, ordering and review of radiographic studies, pulse oximetry, re-evaluation of patient's condition and review of old charts   I assumed direction of critical care for this patient from another provider in my specialty: no     (including critical care time)  Medications Ordered in ED Medications  sodium chloride 0.9 % bolus 1,000 mL (1,000 mLs Intravenous New Bag/Given 10/01/17 1640)  cefTRIAXone (ROCEPHIN) 2 g in sodium chloride 0.9 % 100 mL IVPB (2 g Intravenous New Bag/Given 10/01/17 1711)  doxycycline (VIBRAMYCIN) 100 mg in sodium chloride 0.9 % 250 mL IVPB (has no administration in time range)  sodium chloride 0.9 % bolus 1,000 mL (has no administration in time range)  albuterol (PROVENTIL,VENTOLIN) solution continuous neb (10 mg/hr Nebulization Given 10/01/17 1535)  methylPREDNISolone sodium succinate (SOLU-MEDROL) 125 mg/2 mL injection 125 mg (125 mg Intravenous Given 10/01/17 1544)  ipratropium (ATROVENT) nebulizer solution 1 mg (1 mg Nebulization Given 10/01/17 1535)     Initial Impression / Assessment and Plan / ED Course  I have reviewed the triage vital signs and the nursing notes.  Pertinent labs & imaging results that were available during my care of the patient were reviewed by me and considered in my medical decision making (see chart for details).  Clinical Course as of Oct 02 1726  Mon Oct 01, 2017  1513 67 yo morbidly obese male here with weakness, cough, SOB and fall. No head trauma in fall. On exam, pt in mild resp distress, hypoxic above baseline, with bibasilar rales and wheezes. C/f acute on chronic resp distress, consideration of CHF, COPD, PNA. Will check XR, start breathing tx, steroids, and re-assess.   [CI]    1629 Lactic acid elevated.  Unclear if this is secondary to sepsis given his normal blood pressure otherwise well appearance, versus increased respiratory work of breathing versus heart failure and hypervolemia.  Patient given a small fluid bolus and will follow up remainder of labs.   [CI]  [id number] Lab work shows possible acute on chronic kidney injury, as well as a normal BNP.  Given normal BNP with findings concerning for pneumonia versus atypical infection, will activated to get sepsis and give a full liter.  Again, hesitant to fluid overload given his comorbidities.  Patient sounds and feels better after steroids and breathing treatment.  Will admit for hypoxic respiratory failure secondary to atypical pneumonia.   [CI]    Clinical Course User Index [CI] Shaune Pollack, MD    Final Clinical Impressions(s) / ED Diagnoses   Final diagnoses:  COPD exacerbation [facility])  Atypical pneumonia    ED Discharge Orders    None       Shaune Pollack, MD 10/01/17 1728

## 2017-10-01 NOTE — Telephone Encounter (Deleted)
   Reason for call:   I received a call from Mr. Greer Koeppen at 5:20 PM indicating that she ran out of her prescription of citalopram and questing a refill.   Pertinent Data:   Patient has an history of long-standing depression  and her symptoms are well controlled on citalopram 20 mg daily.She does not want to miss any dose and her next appointment with PCP was scheduled on Oct 10, 2017.  She has no other complaints.   Assessment / Plan / Recommendations:   Refill order for citalopram was sent to [facility] outpatient pharmacy at her request.  As always, pt is advised that if symptoms worsen or new symptoms arise, they should go to an urgent care facility or to to ER for further evaluation.   Arnetha Courser, MD   10/01/2017, 5:23 PM

## 2017-10-02 ENCOUNTER — Encounter (HOSPITAL_COMMUNITY): Payer: Self-pay | Admitting: General Practice

## 2017-10-02 ENCOUNTER — Other Ambulatory Visit: Payer: Self-pay

## 2017-10-02 ENCOUNTER — Observation Stay (HOSPITAL_BASED_OUTPATIENT_CLINIC_OR_DEPARTMENT_OTHER): Payer: Medicare Other

## 2017-10-02 ENCOUNTER — Observation Stay (HOSPITAL_COMMUNITY): Payer: Medicare Other

## 2017-10-02 DIAGNOSIS — Z888 Allergy status to other drugs, medicaments and biological substances status: Secondary | ICD-10-CM | POA: Diagnosis not present

## 2017-10-02 DIAGNOSIS — I69354 Hemiplegia and hemiparesis following cerebral infarction affecting left non-dominant side: Secondary | ICD-10-CM | POA: Diagnosis not present

## 2017-10-02 DIAGNOSIS — J441 Chronic obstructive pulmonary disease with (acute) exacerbation: Secondary | ICD-10-CM

## 2017-10-02 DIAGNOSIS — Z7901 Long term (current) use of anticoagulants: Secondary | ICD-10-CM

## 2017-10-02 DIAGNOSIS — N183 Chronic kidney disease, stage 3 (moderate): Secondary | ICD-10-CM | POA: Diagnosis present

## 2017-10-02 DIAGNOSIS — I272 Pulmonary hypertension, unspecified: Secondary | ICD-10-CM | POA: Diagnosis present

## 2017-10-02 DIAGNOSIS — I4891 Unspecified atrial fibrillation: Secondary | ICD-10-CM

## 2017-10-02 DIAGNOSIS — E1140 Type 2 diabetes mellitus with diabetic neuropathy, unspecified: Secondary | ICD-10-CM

## 2017-10-02 DIAGNOSIS — J3489 Other specified disorders of nose and nasal sinuses: Secondary | ICD-10-CM | POA: Diagnosis not present

## 2017-10-02 DIAGNOSIS — J189 Pneumonia, unspecified organism: Secondary | ICD-10-CM | POA: Diagnosis present

## 2017-10-02 DIAGNOSIS — I509 Heart failure, unspecified: Secondary | ICD-10-CM

## 2017-10-02 DIAGNOSIS — N179 Acute kidney failure, unspecified: Secondary | ICD-10-CM

## 2017-10-02 DIAGNOSIS — G4733 Obstructive sleep apnea (adult) (pediatric): Secondary | ICD-10-CM

## 2017-10-02 DIAGNOSIS — E1122 Type 2 diabetes mellitus with diabetic chronic kidney disease: Secondary | ICD-10-CM | POA: Diagnosis present

## 2017-10-02 DIAGNOSIS — J9621 Acute and chronic respiratory failure with hypoxia: Secondary | ICD-10-CM

## 2017-10-02 DIAGNOSIS — I2729 Other secondary pulmonary hypertension: Secondary | ICD-10-CM | POA: Diagnosis not present

## 2017-10-02 DIAGNOSIS — Z8673 Personal history of transient ischemic attack (TIA), and cerebral infarction without residual deficits: Secondary | ICD-10-CM | POA: Diagnosis not present

## 2017-10-02 DIAGNOSIS — I130 Hypertensive heart and chronic kidney disease with heart failure and stage 1 through stage 4 chronic kidney disease, or unspecified chronic kidney disease: Secondary | ICD-10-CM | POA: Diagnosis present

## 2017-10-02 DIAGNOSIS — Z7951 Long term (current) use of inhaled steroids: Secondary | ICD-10-CM | POA: Diagnosis not present

## 2017-10-02 DIAGNOSIS — R531 Weakness: Secondary | ICD-10-CM

## 2017-10-02 DIAGNOSIS — W010XXA Fall on same level from slipping, tripping and stumbling without subsequent striking against object, initial encounter: Secondary | ICD-10-CM | POA: Diagnosis present

## 2017-10-02 DIAGNOSIS — Z993 Dependence on wheelchair: Secondary | ICD-10-CM

## 2017-10-02 DIAGNOSIS — I5032 Chronic diastolic (congestive) heart failure: Secondary | ICD-10-CM

## 2017-10-02 DIAGNOSIS — E785 Hyperlipidemia, unspecified: Secondary | ICD-10-CM | POA: Diagnosis present

## 2017-10-02 DIAGNOSIS — Z79899 Other long term (current) drug therapy: Secondary | ICD-10-CM | POA: Diagnosis not present

## 2017-10-02 DIAGNOSIS — F1490 Cocaine use, unspecified, uncomplicated: Secondary | ICD-10-CM | POA: Diagnosis present

## 2017-10-02 DIAGNOSIS — R0602 Shortness of breath: Secondary | ICD-10-CM | POA: Diagnosis present

## 2017-10-02 DIAGNOSIS — Z86718 Personal history of other venous thrombosis and embolism: Secondary | ICD-10-CM | POA: Diagnosis not present

## 2017-10-02 DIAGNOSIS — Z9989 Dependence on other enabling machines and devices: Secondary | ICD-10-CM

## 2017-10-02 DIAGNOSIS — H02402 Unspecified ptosis of left eyelid: Secondary | ICD-10-CM | POA: Diagnosis not present

## 2017-10-02 DIAGNOSIS — I482 Chronic atrial fibrillation: Secondary | ICD-10-CM | POA: Diagnosis present

## 2017-10-02 DIAGNOSIS — Z881 Allergy status to other antibiotic agents status: Secondary | ICD-10-CM | POA: Diagnosis not present

## 2017-10-02 DIAGNOSIS — J440 Chronic obstructive pulmonary disease with acute lower respiratory infection: Secondary | ICD-10-CM | POA: Diagnosis present

## 2017-10-02 DIAGNOSIS — Z794 Long term (current) use of insulin: Secondary | ICD-10-CM | POA: Diagnosis not present

## 2017-10-02 LAB — BASIC METABOLIC PANEL
ANION GAP: 13 (ref 5–15)
BUN: 92 mg/dL — AB (ref 6–20)
CALCIUM: 9 mg/dL (ref 8.9–10.3)
CO2: 28 mmol/L (ref 22–32)
Chloride: 94 mmol/L — ABNORMAL LOW (ref 101–111)
Creatinine, Ser: 3.26 mg/dL — ABNORMAL HIGH (ref 0.61–1.24)
GFR calc Af Amer: 21 mL/min — ABNORMAL LOW (ref >60)
GFR, EST NON AFRICAN AMERICAN: 18 mL/min — AB (ref >60)
GLUCOSE: 436 mg/dL — AB (ref 65–99)
POTASSIUM: 4.1 mmol/L (ref 3.5–5.1)
SODIUM: 135 mmol/L (ref 135–145)

## 2017-10-02 LAB — GLUCOSE, CAPILLARY
Glucose-Capillary: 395 mg/dL — ABNORMAL HIGH (ref 65–99)
Glucose-Capillary: 421 mg/dL — ABNORMAL HIGH (ref 65–99)
Glucose-Capillary: 350 mg/dL — ABNORMAL HIGH (ref 65–99)
Glucose-Capillary: 399 mg/dL — ABNORMAL HIGH (ref 65–99)

## 2017-10-02 LAB — ECHOCARDIOGRAM COMPLETE
Height: 73 in
Weight: 6913.63 oz

## 2017-10-02 LAB — CBC
HEMATOCRIT: 41.4 % (ref 39.0–52.0)
Hemoglobin: 12.3 g/dL — ABNORMAL LOW (ref 13.0–17.0)
MCH: 25.1 pg — ABNORMAL LOW (ref 26.0–34.0)
MCHC: 29.7 g/dL — AB (ref 30.0–36.0)
MCV: 84.3 fL (ref 78.0–100.0)
Platelets: 231 10*3/uL (ref 150–400)
RBC: 4.91 MIL/uL (ref 4.22–5.81)
RDW: 21.2 % — AB (ref 11.5–15.5)
WBC: 9.3 10*3/uL (ref 4.0–10.5)

## 2017-10-02 LAB — PROTIME-INR
INR: 2.67
Prothrombin Time: 28.2 seconds — ABNORMAL HIGH (ref 11.4–15.2)

## 2017-10-02 LAB — STREP PNEUMONIAE URINARY ANTIGEN: Strep Pneumo Urinary Antigen: NEGATIVE

## 2017-10-02 LAB — GLUCOSE, RANDOM: Glucose, Bld: 378 mg/dL — ABNORMAL HIGH (ref 65–99)

## 2017-10-02 LAB — HIV ANTIBODY (ROUTINE TESTING W REFLEX): HIV Screen 4th Generation wRfx: NONREACTIVE

## 2017-10-02 MED ORDER — FLUTICASONE PROPIONATE 50 MCG/ACT NA SUSP
2.0000 | Freq: Every day | NASAL | Status: DC | PRN
  Filled 2017-10-02: qty 16

## 2017-10-02 MED ORDER — WARFARIN SODIUM 2.5 MG PO TABS
2.5000 mg | ORAL_TABLET | Freq: Once | ORAL | Status: AC
  Administered 2017-10-02: 2.5 mg via ORAL
  Filled 2017-10-02: qty 1

## 2017-10-02 MED ORDER — TORSEMIDE 20 MG PO TABS: 40.0000 mg | ORAL_TABLET | Freq: Every day | ORAL | Status: DC

## 2017-10-02 MED ORDER — BENZONATATE 100 MG PO CAPS
200.0000 mg | ORAL_CAPSULE | Freq: Three times a day (TID) | ORAL | Status: DC | PRN
Start: 2017-10-02 — End: 2017-10-04
  Administered 2017-10-03 – 2017-10-04 (×2): 200 mg via ORAL
  Filled 2017-10-02 (×4): qty 2

## 2017-10-02 MED ORDER — TORSEMIDE 20 MG PO TABS
60.0000 mg | ORAL_TABLET | Freq: Every day | ORAL | Status: DC
  Administered 2017-10-03 – 2017-10-04 (×2): 60 mg via ORAL
  Filled 2017-10-02 (×2): qty 3

## 2017-10-02 MED ORDER — INSULIN ASPART 100 UNIT/ML ~~LOC~~ SOLN
10.0000 [IU] | Freq: Three times a day (TID) | SUBCUTANEOUS | Status: DC
  Administered 2017-10-02 – 2017-10-03 (×3): 10 [IU] via SUBCUTANEOUS

## 2017-10-02 MED ORDER — TORSEMIDE 20 MG PO TABS
40.0000 mg | ORAL_TABLET | Freq: Every day | ORAL | Status: DC
  Administered 2017-10-03: 40 mg via ORAL
  Filled 2017-10-02: qty 2

## 2017-10-02 MED ORDER — FUROSEMIDE 10 MG/ML IJ SOLN
40.0000 mg | Freq: Once | INTRAMUSCULAR | Status: AC
  Administered 2017-10-02: 40 mg via INTRAVENOUS
  Filled 2017-10-02: qty 4

## 2017-10-02 MED ORDER — ISOSORBIDE MONONITRATE ER 30 MG PO TB24
30.0000 mg | ORAL_TABLET | Freq: Every day | ORAL | Status: DC
  Administered 2017-10-02 – 2017-10-04 (×3): 30 mg via ORAL
  Filled 2017-10-02 (×3): qty 1

## 2017-10-02 MED ORDER — INSULIN GLARGINE 100 UNIT/ML ~~LOC~~ SOLN
60.0000 [IU] | Freq: Two times a day (BID) | SUBCUTANEOUS | Status: DC
  Administered 2017-10-02 – 2017-10-04 (×4): 60 [IU] via SUBCUTANEOUS
  Filled 2017-10-02 (×5): qty 0.6

## 2017-10-02 NOTE — Evaluation (Signed)
Physical Therapy Evaluation Patient Details Name: Seth Dudley MRN: [id number] DOB: Sep 16, 1950 Today's Date: 10/02/2017   History of Present Illness  Seth Dudley is a 61 yoM ex-smoker with a PMH of CHF, COPD requiring home oxygen, morbid obesity, HTN, DM c/b neuropathy, HLD, CKD-3, OSA requiring CPAP, CVA, pulmonary HTN, chronic AF on warfarin who presented to the ED for worsening cough, dyspnea on exertion and generalized weakness.  Clinical Impression  Pt admitted with above. Pt was transferring and amb household distances with RW PTA. Pt now requires modAx2 for bed mobility and transfer to chair. Pt with significant SOB with mobility. SpO2 at 86% on 3LO2 via [place] during transfer to chair.  Pt does have a home health aide 3hours a day however pt unable to transfer without significant help and wife can not provide mod/maxA level assist. Pt declining SNF recommendation. PT to con't to work with patient however will need SNF as pt at an increased falls risk if he doesn't progress to supervision level of assist.    Follow Up Recommendations SNF;Supervision/Assistance - 24 hour    Equipment Recommendations  None recommended by PT    Recommendations for Other Services       Precautions / Restrictions Precautions Precautions: Fall Precaution Comments: morbidly obese Restrictions Weight Bearing Restrictions: No      Mobility  Bed Mobility Overal bed mobility: Needs Assistance Bed Mobility: Supine to Sit     Supine to sit: Mod assist;Max assist;+2 for physical assistance     General bed mobility comments: pt on air bed, air deflated, pt required modA to move bilat LE laterally off EOB, due to body habitus pt unable to reach rail and required maxAx2 to elevate trunk and scoot to EOB  Transfers Overall transfer level: Needs assistance Equipment used: Rolling walker (2 wheeled) Transfers: Sit to/from [employer] Sit to Stand: Mod assist;+2 physical  assistance Stand pivot transfers: Min assist;+2 physical assistance       General transfer comment: bed raised, pt required modAx2 to power up and prevent leaning to far forward. pt then able to advance LEs while UE dependent on RW to complete std pvt to chair. pt with +SOB, SpO2 at 86% on 3LO2 via [place].   Ambulation/Gait             General Gait Details: unable this date due to +SOB and generalized weakness  Stairs            Wheelchair Mobility    Modified Rankin (Stroke Patients Only)       Balance Overall balance assessment: Needs assistance Sitting-balance support: Feet supported;No upper extremity supported Sitting balance-Leahy Scale: Good     Standing balance support: Bilateral upper extremity supported Standing balance-Leahy Scale: Poor Standing balance comment: dependent on RW                             Pertinent Vitals/Pain Pain Assessment: No/denies pain    Home Living Family/patient expects to be discharged to:: Private residence Living Arrangements: Spouse/significant other Available Help at Discharge: Family;Personal care attendant;Available 24 hours/day Type of Home: House Home Access: Ramped entrance     Home Layout: One level Home Equipment: Walker - 2 wheels;Bedside commode;Grab bars - toilet;Wheelchair - Engineer, technical sales - power      Prior Function Level of Independence: Needs assistance   Gait / Transfers Assistance Needed: uses walker in home, has only left home 2x in the last year.  ADL's /  Homemaking Assistance Needed: aide gives patient a sponge bath daily except sat/sunday        Hand Dominance        Extremity/Trunk Assessment   Upper Extremity Assessment Upper Extremity Assessment: Overall WFL for tasks assessed    Lower Extremity Assessment Lower Extremity Assessment: Generalized weakness       Communication   Communication: (difficult to understand)  Cognition Arousal/Alertness:  Awake/alert Behavior During Therapy: WFL for tasks assessed/performed Overall Cognitive Status: Within Functional Limits for tasks assessed                                        General Comments General comments (skin integrity, edema, etc.): pt with bilat leg wraps for soars/ulcers    Exercises General Exercises - Lower Extremity Long Arc Quad: AROM;Both;20 reps;Seated   Assessment/Plan    PT Assessment Patient needs continued PT services  PT Problem List Decreased strength;Decreased activity tolerance;Decreased balance;Decreased mobility;Cardiopulmonary status limiting activity       PT Treatment Interventions DME instruction;Gait training;Stair training;Functional mobility training;Therapeutic activities;Therapeutic exercise;Balance training    PT Goals (Current goals can be found in the Care Plan section)  Acute Rehab PT Goals Patient Stated Goal: breathe better PT Goal Formulation: With patient Time For Goal Achievement: 10/16/17 Potential to Achieve Goals: Fair    Frequency Min 3X/week   Barriers to discharge Decreased caregiver support has aide that comes 3 hours a day M-F, wife present but can't provide medical assist    Co-evaluation               AM-PAC PT "6 Clicks" Daily Activity  Outcome Measure Difficulty turning over in bed (including adjusting bedclothes, sheets and blankets)?: Unable Difficulty moving from lying on back to sitting on the side of the bed? : Unable Difficulty sitting down on and standing up from a chair with arms (e.g., wheelchair, bedside commode, etc,.)?: Unable Help needed moving to and from a bed to chair (including a wheelchair)?: A Lot Help needed walking in hospital room?: A Lot Help needed climbing 3-5 steps with a railing? : Total 6 Click Score: 8    End of Session Equipment Utilized During Treatment: Oxygen(3Lo2 via [place]) Activity Tolerance: (limited by SOB) Patient left: in chair;with call bell/phone  within reach Nurse Communication: Mobility status PT Visit Diagnosis: Unsteadiness on feet (R26.81)    Time: 4098-1191 PT Time Calculation (min) (ACUTE ONLY): 45 min   Charges:   PT Evaluation $PT Eval Moderate Complexity: 1 Mod PT Treatments $Therapeutic Activity: 23-37 mins   PT G Codes:        Lewis Shock, PT, DPT Pager #: [phone] Office #: [phone]   Noelani Harbach M Farris Geiman 10/02/2017, 1:28 PM

## 2017-10-02 NOTE — Progress Notes (Signed)
  Echocardiogram 2D Echocardiogram has been performed.  Tye Savoy 10/02/2017, 10:13 AM

## 2017-10-02 NOTE — Progress Notes (Signed)
RT to placed CPAP. Patient is preparing to transfer to floor. Prior transfer delayed.

## 2017-10-02 NOTE — Progress Notes (Signed)
Subjective: Mr. Demirjian was down in X-Ray/Echo when the team initially visited on rounds. His nurse reported that he was requesting a cough suppressant.  This afternoon, he was sitting upright in the recliner, legs elevated. Bilateral extremities were unwrapped. He has severe and prolonged coughing spells when asked to take deep breaths. Attempts to obtain records from the [place] have been unsuccessful to date.  Objective:  Vital signs in last 24 hours: Vitals:   10/02/17 0340 10/02/17 0430 10/02/17 0824 10/02/17 1131  BP: (!) 122/59  (!) 142/89 (!) 134/55  Pulse: 88  84 78  Resp: 18  16 18   Temp: 97.8 F (36.6 C)  98.4 F (36.9 C) 98.1 F (36.7 C)  TempSrc: Oral  Oral Oral  SpO2: 100%  92% 95%  Weight:  (!) 432 lb 1.6 oz (196 kg)    Height:       Physical Exam  Nursing note and vitals reviewed.  BMP Latest Ref Rng & Units 10/02/2017 10/02/2017 10/01/2017  Glucose 65 - 99 mg/dL 378(H) 436(H) 151(H)  BUN 6 - 20 mg/dL - 92(H) 100(H)  Creatinine 0.61 - 1.24 mg/dL - 3.26(H) 3.57(H)  Sodium 135 - 145 mmol/L - 135 139  Potassium 3.5 - 5.1 mmol/L - 4.1 3.7  Chloride 101 - 111 mmol/L - 94(L) 96(L)  CO2 22 - 32 mmol/L - 28 28  Calcium 8.9 - 10.3 mg/dL - 9.0 9.7   Lab Results  Component Value Date   WBC 9.3 10/02/2017   HGB 12.3 (L) 10/02/2017   HCT 41.4 10/02/2017   MCV 84.3 10/02/2017   PLT 231 10/02/2017   Lab Results  Component Value Date   HGBA1C 8.9 (H) 10/01/2017   Echo 10/02/17: Study Conclusions - Left ventricle: The cavity size was normal. Wall thickness was increased in a pattern of mild LVH. Systolic function was vigorous. The estimated ejection fraction was in the range of 65% to 70%. Wall motion was normal; there were no regional wall motion abnormalities. There was no evidence of elevated ventricular filling pressure by Doppler parameters.  CXR 10/02/17: FINDINGS: Increased interstitial markings, possibly reflecting chronic lung disease, although mild interstitial  edema is also possible (although not favored). Mild left basilar opacity, likely atelectasis. No pleural effusions or pneumothorax. Mild cardiomegaly.  Assessment/Plan:  Principal Problem:   COPD with acute exacerbation (HCC) Active Problems:   Diabetes mellitus with neuropathy (HCC)   Hypertension   Obstructive sleep apnea   Chronic diastolic CHF (congestive heart failure) (HCC)   History of COPD  #COPD Exacerbation: Positive sick contacts with increased sputum production, oxygen requirement, and shortness of breath consistent with COPD exacerbation, potentially viral in origin. He remains afebrile and does not have an elevated white blood count. Initial portable chest X-ray was poorly penetrated and revealed mild interstitial densities bilaterally. He was started on doxycycline and ceftriaxone in the ED. Elevated lactic acid of 5.1 on admission, met requirements for code sepsis, now decreased to 2.3. - Sat 93% on 5L - Repeat CXR with left basilar atelectasis and chronic changes - s/p 123m IV solumedrol - 655mpo prednisone qd, ceftriaxone  - Doxycycline 10060mo BID - DuoNebs, albuterol, ipratropium - Dulera 2 puffs BID - Tessalon pearls prn for cough; encouraged for sleep only  #DM with neuropathy: Home regimen per patient of 60u regular mealtime (breakfast, 6 PM and 9 PM) and 70u Lantus BID. SSI and 60u Lantus BID. Wound care recs Tues/Friday schedule. - Urinalysis WNL, negative for S. pneumo -  A1c 8.9, uncontrolled - Blood sugars elevated d/t delay in nightly Lantus administration - Percocet 5-397m q6h prn for pain  #Cardiac (CHF, HTN, Afib): EKG with no acute changes. Continue home warfarin, aldactone, metoprolol, high dose atorvastatin, Imdur. Extremities remain edematous bilaterally. S/p 2L IVF. - Daily PT-INR, pharmacy following - Echocardiogram with EF >65-70% - s/p IV lasix 432mOTO - Resume torsemide today, holding metolazone  #OSA: Wears CPAP nightly. RT to  see.  #s/p CVA: Generalized weakness bilaterally with marked inability to raise, dorsiflex or plantar flex left extremity. Unclear if this is residual deficit or new problem. Patient is wheelchair bound at baseline but typically able to ambulate short distances around the house. - PT recommending SNF if pt fails to progress - MRI Brain pending  #Chronic Medical Conditions: Continue home zoloft, allopurinol, terazosin, protonix.  #PPX: PEG prn, Zofran prn, on warfarin  Dispo: Anticipated discharge in approximately 1-2 day(s), pending clinical improvement.   PoLaureen OchsMedical Student 10/02/2017, 1:55 PM Pager: [phone] for Student Documentation:  I personally was present and performed or re-performed the history, physical exam and medical decision-making activities of this service and have verified that the service and findings are accurately documented in the student's note.  AmLorella NimrodMD 10/02/2017, 3:44 PM

## 2017-10-02 NOTE — Progress Notes (Signed)
CPAP at bedside. Patient arrived to floor from ED. Receiving RT aware.

## 2017-10-02 NOTE — Progress Notes (Addendum)
Date: 10/02/2017  Patient name: Seth Dudley  Medical record number: [id number]  Date of birth: [birth year]   I saw and evaluated the patient. I reviewed the resident's note and I agree with the resident's findings and plan as documented in the resident's note.  Chief Complaint(s): weakness, shortness of breath  History - key components related to admission:  Please see resident note for details.  Briefly, [name] is a 67 yo man with complex medical history including morbid obesity, HFpEF, COPD, pHTN, CVA, DM, HTN, HLD, Afib on warfarin, CKD III, and OSA, who has had increased dyspnea and increased O2 requirements for the past 3 weeks. He has had a cough that is occasionally productive of brown sputum. He reports dyspnea and chest pain with exertion that significantly limits his activity. On the day of admission, he had come to [facility] to obtain diabetic shoes, but was unable to get into his Zenaida Niece as his leg gave out on him twice.   He receives most of his care at the [facility], primarily through home visits with wound care, CSW, dietitian, pharmacy, and his doctor.   Physical Exam - key components related to admission:  Vitals:   10/02/17 0824 10/02/17 1131 10/02/17 1520 10/02/17 1545  BP: (!) 142/89 (!) 134/55  116/73  Pulse: 84 78  83  Resp: Temp: 98.4 F (36.9 C) 98.1 F (36.7 C)  97.6 F (36.4 C)  TempSrc: Oral Oral  Oral  SpO2: 92% 95% 94% 93%  Weight:      Height:        General: Alert, pleasant, no distress HEENT: Moist mucous membranes, ptosis of left eye Neck: Unable to assess JVD due to neck size Cardiovascular: Distant heart sounds, regular rate and rhythm, no murmurs rubs or gallops Pulmonary: Distant lung sounds, normal work of breathing Abdominal: Obese, soft, nontender, not distended, normal bowel sounds Extremities: Warm, significant edema with healing ulcerations of both shins, mild clubbing Neuro: Alert and oriented, speech normal,  ptosis of left eye, strength 4/5 in left arm, 2/5 in left leg, 3/5 in right leg  Significant test results: VBG 7.35/58 Lactate 5.0 --> 2.3 BUN 100 --> 92 Cr 3.6 --> 3.3 LFTs wnl except albumin 3.3 CBC wnl BNP 88 Trop neg Procalcitonin 0.3 INR 2.4 UA wnl A1c 8.9  EKG no acute ischemic changes  CXR: increased interstitial marking suggestive of CLD vs edema  Assessment and Plan: I have seen and evaluated the patient as outlined in the resident's note. I agree with the formulated Assessment and Plan as detailed in the resident's note, with the following changes:   Principal Problem:   COPD with acute exacerbation (HCC) Active Problems:   Diabetes mellitus with neuropathy (HCC)   Hypertension   Obstructive sleep apnea   Chronic diastolic CHF (congestive heart failure) (HCC)   History of COPD   1. COPD exacerbation with acute on chronic hypoxic respiratory failure: increased cough, dyspnea, and sputum production with change in color. Worsening O2 requirement.  - treat with steroids, azithromycin, and bronchodilators - will need to demonstrate mobility without desat on home O2 or dyspnea before discharge  2. Acute kidney injury on CKD III: unclear baseline, but much worse than prior record in our system 5 years ago - will obtain records from [facility] for more recent baseline - held diuretics on admission, will now resume as below, trend BUN/Cr  3. Chronic CHF: likely HFpEF based on records, repeat TTE here with  preserved EF, no evidence of pHTN, although poorly visualized, IVC dilated with reduced respirophasic variation suggestive of elevated CVP - carefully resume home torsemide, continue to hold metolazone - difficult to assess volume status on exam, may need repeat IVC [place] tomorrow to assess effect - continue remainder of CHF and Afib meds (metoprolol, atorvastatin, nitrates, spironolactone) - CPAP at night  4. Weakness: difficult to assess chronicity, but he indicates he  has been weak on his left side since his stroke, but it is worse in the last few weeks. Possibly due to generalized weakness in the context of acute illness on top of chronic hemiparesis. However, cannot rule out new stroke. Outside of window for tPA, no need for emergent evaluation. Will evaluate with MRI brain here, although would be preferable to obtain in [place] system so his primary providers can access, but he would have difficulty getting to and from outpatient MRI. - MRI brain  5. DM: sugars currently running high, insulin dosing was delayed today, will ensure receives his insulin and adjust as needed  Anne Shutter, MD 5/21/20194:16 PM

## 2017-10-02 NOTE — Discharge Instructions (Signed)
Information on my medicine - Coumadin®   (Warfarin) °You were taking this medication prior to this hospital admission. ° °Why was Coumadin prescribed for you? °Coumadin was prescribed for you because you have a blood clot or a medical condition that can cause an increased risk of forming blood clots. Blood clots can cause serious health problems by blocking the flow of blood to the heart, lung, or brain. Coumadin can prevent harmful blood clots from forming. °As a reminder your indication for Coumadin is:   Stroke Prevention Because Of Atrial Fibrillation ° °What test will check on my response to Coumadin? °While on Coumadin (warfarin) you will need to have an INR test regularly to ensure that your dose is keeping you in the desired range. The INR (international normalized ratio) number is calculated from the result of the laboratory test called prothrombin time (PT). ° °If an INR APPOINTMENT HAS NOT ALREADY BEEN MADE FOR YOU please schedule an appointment to have this lab work done by your health care provider within 7 days. °Your INR goal is usually a number between:  2 to 3 or your provider may give you a more narrow range like 2-2.5.  Ask your health care provider during an office visit what your goal INR is. ° °What  do you need to  know  About  COUMADIN? °Take Coumadin (warfarin) exactly as prescribed by your healthcare provider about the same time each day.  DO NOT stop taking without talking to the doctor who prescribed the medication.  Stopping without other blood clot prevention medication to take the place of Coumadin may increase your risk of developing a new clot or stroke.  Get refills before you run out. ° °What do you do if you miss a dose? °If you miss a dose, take it as soon as you remember on the same day then continue your regularly scheduled regimen the next day.  Do not take two doses of Coumadin at the same time. ° °Important Safety Information °A possible side effect of Coumadin (Warfarin) is  an increased risk of bleeding. You should call your healthcare provider right away if you experience any of the following: °? Bleeding from an injury or your nose that does not stop. °? Unusual colored urine (red or dark brown) or unusual colored stools (red or black). °? Unusual bruising for unknown reasons. °? A serious fall or if you hit your head (even if there is no bleeding). ° °Some foods or medicines interact with Coumadin® (warfarin) and might alter your response to warfarin. To help avoid this: °? Eat a balanced diet, maintaining a consistent amount of Vitamin K. °? Notify your provider about major diet changes you plan to make. °? Avoid alcohol or limit your intake to 1 drink for women and 2 drinks for men per day. °(1 drink is 5 oz. wine, 12 oz. beer, or 1.5 oz. liquor.) ° °Make sure that ANY health care provider who prescribes medication for you knows that you are taking Coumadin (warfarin).  Also make sure the healthcare provider who is monitoring your Coumadin knows when you have started a new medication including herbals and non-prescription products. ° °Coumadin® (Warfarin)  Major Drug Interactions  °Increased Warfarin Effect Decreased Warfarin Effect  °Alcohol (large quantities) °Antibiotics (esp. Septra/Bactrim, Flagyl, Cipro) °Amiodarone (Cordarone) °Aspirin (ASA) °Cimetidine (Tagamet) °Megestrol (Megace) °NSAIDs (ibuprofen, naproxen, etc.) °Piroxicam (Feldene) °Propafenone (Rythmol SR) °Propranolol (Inderal) °Isoniazid (INH) °Posaconazole (Noxafil) Barbiturates (Phenobarbital) °Carbamazepine (Tegretol) °Chlordiazepoxide (Librium) °Cholestyramine (Questran) °Griseofulvin °Oral Contraceptives °Rifampin °  Sucralfate (Carafate) °Vitamin K  ° °Coumadin® (Warfarin) Major Herbal Interactions  °Increased Warfarin Effect Decreased Warfarin Effect  °Garlic °Ginseng °Ginkgo biloba Coenzyme Q10 °Green tea °St. John’s wort   ° °Coumadin® (Warfarin) FOOD Interactions  °Eat a consistent number of servings per  week of foods HIGH in Vitamin K °(1 serving = ½ cup)  °Collards (cooked, or boiled & drained) °Kale (cooked, or boiled & drained) °Mustard greens (cooked, or boiled & drained) °Parsley *serving size only = ¼ cup °Spinach (cooked, or boiled & drained) °Swiss chard (cooked, or boiled & drained) °Turnip greens (cooked, or boiled & drained)  °Eat a consistent number of servings per week of foods MEDIUM-HIGH in Vitamin K °(1 serving = 1 cup)  °Asparagus (cooked, or boiled & drained) °Broccoli (cooked, boiled & drained, or raw & chopped) °Brussel sprouts (cooked, or boiled & drained) *serving size only = ½ cup °Lettuce, raw (green leaf, endive, romaine) °Spinach, raw °Turnip greens, raw & chopped  ° °These websites have more information on Coumadin (warfarin):  www.coumadin.com; °www.ahrq.gov/consumer/coumadin.htm; ° ° ° °

## 2017-10-02 NOTE — Progress Notes (Signed)
Inpatient Diabetes Program Recommendations  AACE/ADA: New Consensus Statement on Inpatient Glycemic Control (2015)  Target Ranges:  Prepandial:   less than 140 mg/dL      Peak postprandial:   less than 180 mg/dL (1-2 hours)      Critically ill patients:  140 - 180 mg/dL  Results for Seth Dudley, Seth Dudley (MRN [id number]) as of 10/02/2017 10:18  Ref. Range 10/01/2017 21:12 10/02/2017 07:34  Glucose-Capillary Latest Ref Range: 65 - 99 mg/dL 409 (H) 811 (H)   Results for Seth Dudley, Seth Dudley (MRN [id number]) as of 10/02/2017 10:18  Ref. Range 10/01/2017 18:38  Hemoglobin A1C Latest Ref Range: 4.8 - 5.6 % 8.9 (H)   Review of Glycemic Control  Diabetes history: DM2 Outpatient Diabetes medications: Lantus 70 units BID, Regular 60 units TID with meals Current orders for Inpatient glycemic control: Lantus 40 units BID, Novolog 0-20 units TID with meals, Novolog 0-5 units QHS; Prednisone 60 mg QAM  Inpatient Diabetes Program Recommendations: Insulin - Basal: Note that patient is ordered Lantus 40 units BID and patient received one dose of Lantus 40 units yesterday. Insulin - Meal Coverage: If post prandial glucose is consistently elevated >180 mg/dl, please consider ordering Novolog 4 units TID with meals for meal coverage if patient eats at least 50% of meals. HgbA1C: A1C 8.9% on 10/01/17 indicating an average glucose of 209 mg/dl over the past 2-3 months.  NOTE: Patient received Solumedrol 125 mg x 1 on 10/01/17 and ordered Prednisone 60 mg QAM which is contributing to hyperglycemia.  Thanks, Orlando Penner, RN, MSN, CDE Diabetes Coordinator Inpatient Diabetes Program [phone] (Team Pager from 8am to 5pm)

## 2017-10-02 NOTE — Consult Note (Signed)
WOC Nurse wound consult note Reason for Consult: Bilateral LEs with modified Unna's boot compression therapy. Performed by [facility] twice weekly on Tuesdays and Thursdays. We will change to Tuesdays and Fridays. Wound type: Venous insufficiency with no ulceration.  Clinical CEAP 5  Pressure Injury POA: NA Measurement: N/A Wound bed: N/A Drainage (amount, consistency, odor) None Periwound: hemosiderin staining Dressing procedure/placement/frequency: Ortho tech to apply Unna's Boots on Tuesdays and Fridays from toe to knee to maintain compression and prevent ulceration. Heel cup of foam are used to prevent pressure injury due to lateral rotation.  WOC nursing team will not follow, but will remain available to this patient, the nursing and medical teams.  Please re-consult if needed. Thanks, Ladona Mow, MSN, RN, GNP, [place]  Pager# [phone]

## 2017-10-02 NOTE — Progress Notes (Signed)
Nutrition Brief Note  RD received consult as part of COPD protocol. Spoke with pt at bedside who reports no nutrition-related issues. Per pt, he eats 3 meals daily. Breakfast includes eggs and sausage, Lunch includes a sandwich. Dinner varies but may include pintos, rice, and BBQ pork chops.  Pt denies any recent weight loss.  Wt Readings from Last 15 Encounters:  10/02/17 (!) 432 lb 1.6 oz (196 kg)  06/14/12 (!) 396 lb 6 oz (179.8 kg)    Body mass index is 57.01 kg/m. Patient meets criteria for Obesity Class III based on current BMI.   Current diet order is Heart Healthy/Carb Modified, patient is consuming approximately 100% of meals at this time. Labs and medications reviewed.   No nutrition interventions warranted at this time. If nutrition issues arise, please consult RD.   Seth Reading, MS, RD, LDN Pager: [phone] Weekend/After Hours: [phone]

## 2017-10-02 NOTE — Telephone Encounter (Signed)
Encounter entered in error.

## 2017-10-02 NOTE — Progress Notes (Signed)
Orthopedic Tech Progress Note Patient Details:  Seth Dudley [birth year] [id number]  Ortho Devices Type of Ortho Device: Roland Rack boot Ortho Device/Splint Location: bilateral Ortho Device/Splint Interventions: Application   Post Interventions Patient Tolerated: Well Instructions Provided: Care of device   Nikki Dom 10/02/2017, 2:19 PM

## 2017-10-02 NOTE — Progress Notes (Addendum)
ANTICOAGULATION CONSULT NOTE -follow up Pharmacy Consult for warfarin Indication: atrial fibrillation  Allergies  Allergen Reactions  . Citalopram Other (See Comments)    Unknown reaction  . Lisinopril Other (See Comments)    Unknown reaction  . Vancomycin Other (See Comments)    Unknown reaction    Patient Measurements: Height:  (185.4 cm) Weight: (!) 432 lb 1.6 oz (196 kg)(weight taken after MN) IBW/kg (Calculated) : 79.9 Heparin Dosing Weight: n/a   Vital Signs: Temp: 98.4 F (36.9 C) (05/21 0824) Temp Source: Oral (05/21 0824) BP: 142/89 (05/21 0824) Pulse Rate: 84 (05/21 0824)  Labs: Recent Labs    10/01/17 1459 10/02/17 0557  HGB 13.1 12.3*  HCT 44.1 41.4  PLT 236 231  LABPROT 26.1* 28.2*  INR 2.42 2.67  CREATININE 3.57* 3.26*  TROPONINI <0.03  --     Estimated Creatinine Clearance: 39.8 mL/min (A) (by C-G formula based on SCr of 3.26 mg/dL (H)).   Medical History: Past Medical History:  Diagnosis Date  . Chronic a-fib (HCC)    a. rate-controlled and on coumadin  . Chronic diastolic CHF (congestive heart failure) (HCC)    a. EF 55%  . Chronic kidney disease, stage III (moderate) (HCC)   . Diabetes mellitus with neuropathy (HCC)   . History of COPD    a. on home O2  . History of CVA (cerebrovascular accident)   . History of DVT (deep vein thrombosis)    a. chronic coumadin  . History of substance abuse   . History of TIA (transient ischemic attack)   . Hyperlipidemia   . Hypertension   . Morbid obesity (HCC)   . Obstructive sleep apnea    a. On CPAP.  Marland Kitchen Pulmonary hypertension (HCC)     Medications:  Medications Prior to Admission  Medication Sig Dispense Refill Last Dose  . albuterol (PROVENTIL HFA;VENTOLIN HFA) 108 (90 Base) MCG/ACT inhaler Inhale 2 puffs into the lungs every 4 (four) hours as needed for wheezing or shortness of breath.   10/01/2017 at am  . albuterol (PROVENTIL) (2.5 MG/3ML) 0.083% nebulizer solution Take 2.5 mg by  nebulization every 6 (six) hours as needed for wheezing or shortness of breath.   09/30/2017 at pm  . allopurinol (ZYLOPRIM) 100 MG tablet Take 200 mg by mouth daily. For gout   10/01/2017 at am  . atorvastatin (LIPITOR) 80 MG tablet Take 80 mg by mouth at bedtime. For cholesterol   09/30/2017 at pm  . budesonide-formoterol (SYMBICORT) 160-4.5 MCG/ACT inhaler Inhale 2 puffs into the lungs 2 (two) times daily.   10/01/2017 at am  . cholecalciferol (VITAMIN D) 1000 UNITS tablet Take 3,000 Units by mouth daily.   10/01/2017 at am  . clotrimazole (LOTRIMIN) 1 % cream Apply 1 application topically 2 (two) times daily as needed (skin irritation - back and legs).    09/27/2017 at Unknown time  . DEXTROSE, DIABETIC USE, PO Take 15 g by mouth See admin instructions. Use 1 tube (15 grams of glucose) by mouth as directed for low blood sugar with symptoms   week ago  . fluticasone (FLONASE) 50 MCG/ACT nasal spray Place 2 sprays into both nostrils daily as needed for allergies or rhinitis (congestion).   week ago  . insulin glargine (LANTUS) 100 UNIT/ML injection Inject 70 Units into the skin 2 (two) times daily.    10/01/2017 at am  . insulin regular (NOVOLIN R,HUMULIN R) 100 units/mL injection Inject 60 Units into the skin 3 (three) times daily  before meals.   10/01/2017 at am  . isosorbide mononitrate (IMDUR) 30 MG 24 hr tablet Take 30 mg by mouth daily.   10/01/2017 at am  . magnesium oxide (MAG-OX) 400 MG tablet Take 400 mg by mouth daily.   10/01/2017 at am  . metolazone (ZAROXOLYN) 2.5 MG tablet Take 2.5 mg by mouth See admin instructions. Take 1 tablet (2.5 mg) by mouth daily for 3 days for weight gain of more then 3 lbs/day or 5 lbs/week   10/01/2017 at am  . metoprolol tartrate (LOPRESSOR) 50 MG tablet Take 50 mg by mouth 2 (two) times daily.   10/01/2017 at 9-10am  . miconazole (MICOTIN) 2 % powder Apply topically 2 (two) times daily. Apply to areas of fungal rash including skin folds under arms and groin    10/01/2017 at am  . omeprazole (PRILOSEC) 20 MG capsule Take 20 mg by mouth daily before breakfast. Take 30 minutes before meal   10/01/2017 at am  . oxyCODONE-acetaminophen (PERCOCET/ROXICET) 5-325 MG tablet Take 1 tablet by mouth 4 (four) times daily as needed (pain).   10/01/2017 at 300  . polyethylene glycol (MIRALAX / GLYCOLAX) packet Take 17 g by mouth daily as needed (constipation).   unknown  . potassium chloride SA (K-DUR,KLOR-CON) 20 MEQ tablet Take 20 mEq by mouth 2 (two) times daily.   10/01/2017 at am  . sennosides-docusate sodium (SENOKOT-S) 8.6-50 MG tablet Take 3 tablets by mouth 2 (two) times daily.    10/01/2017 at am  . sertraline (ZOLOFT) 100 MG tablet Take 100 mg by mouth daily.   10/01/2017 at am  . Skin Protectants, Misc. (ALOE VESTA PROTECTIVE) OINT Apply 1 application topically 2 (two) times daily. For protection of coccyx   10/01/2017 at am  . sodium chloride (OCEAN) 0.65 % SOLN nasal spray Place 2 sprays into both nostrils 5 (five) times daily as needed for congestion.   unknown  . spironolactone (ALDACTONE) 25 MG tablet Take 25 mg by mouth daily.   10/01/2017 at am  . terazosin (HYTRIN) 2 MG capsule Take 4 mg by mouth at bedtime.   09/30/2017 at pm  . tolnaftate (TINACTIN) 1 % powder Apply 1 application topically daily.   5/19?  Marland Kitchen torsemide (DEMADEX) 20 MG tablet Take 40-60 mg by mouth See admin instructions. Take 3 tablets (60 mg) by mouth every morning and 2 tablets (40 mg) every night - for congestive heart failure   10/01/2017 at am  . triamcinolone cream (KENALOG) 0.1 % Apply 1 application topically See admin instructions. Add 1 teaspoonful to liberal amount of eucerin cream and apply to lower legs before wrapping twice weekly   09/28/2017  . warfarin (COUMADIN) 5 MG tablet Take 2.5-5 mg by mouth See admin instructions. Take 1/2 tablet (2.5 mg) by mouth Monday, Wednesday, Friday at 6pm, take 1 tablet (5 mg) Sunday, Tuesday, Thursday, Saturday at 6pm   09/30/2017 at 1800  .  metoprolol (LOPRESSOR) 100 MG tablet Take 1 tablet (100 mg total) by mouth 2 (two) times daily. (Patient not taking: Reported on 10/01/2017) 180 tablet 3 Not Taking at Unknown time    Assessment: 65 YOM with a history of Afib on warfarin here with shortness of breath. Pharmacy consulted to resume home warfarin. INR on admission was therapeutic at 2.42. His last dose of warfarin taken pta was 5/19. Patient has been started on doxycycline which can interact with warfarin to increase hypoprothrombinemic effect of warfarin.  Today the INR 2.67 remains therapeutic, on continued  home dose regimen.  Potential for doxycycline to increase warfarin effect.  INR increased today 2.42 to 2.67 and has received 2 doses of doxycycline so far.  Meal intake thus far: 100% breakfast eaten.   Home warfarin regimen: 2.5 mg on M/W/F; 5 mg on all other days   Goal of Therapy:  INR 2-3 Monitor platelets by anticoagulation protocol: Yes   Plan:  Decrease warfarin to 2.5 mg today x1 (due to probable DDI with doxycycline) Monitor daily PT/INR    Thank you for allowing pharmacy to be part of this patients care team. Noah Delaine, RPh Clinical Pharmacist Pager: [phone] 08:00-15:29: [phone] 15:30-22:15: [phone] After 22:15: Main Pharmacy [phone] 10/02/2017 11:07 AM

## 2017-10-02 NOTE — Progress Notes (Signed)
RT attempted to place CPAP. Patient preparing to transfer to floor.

## 2017-10-02 NOTE — Progress Notes (Signed)
  Pt admitted to the unit. Pt is stable, alert and oriented per baseline. Oriented to room, staff, and call bell. Educated to call for any assistance. Bed in lowest position, call bell within reach- will continue to monitor. 

## 2017-10-03 ENCOUNTER — Inpatient Hospital Stay (HOSPITAL_COMMUNITY): Payer: Medicare Other

## 2017-10-03 ENCOUNTER — Encounter (HOSPITAL_COMMUNITY): Payer: Self-pay

## 2017-10-03 DIAGNOSIS — J3489 Other specified disorders of nose and nasal sinuses: Secondary | ICD-10-CM

## 2017-10-03 DIAGNOSIS — H02402 Unspecified ptosis of left eyelid: Secondary | ICD-10-CM

## 2017-10-03 LAB — BASIC METABOLIC PANEL
Anion gap: 13 (ref 5–15)
BUN: 92 mg/dL — ABNORMAL HIGH (ref 6–20)
CO2: 30 mmol/L (ref 22–32)
Calcium: 9.7 mg/dL (ref 8.9–10.3)
Chloride: 94 mmol/L — ABNORMAL LOW (ref 101–111)
Creatinine, Ser: 2.7 mg/dL — ABNORMAL HIGH (ref 0.61–1.24)
GFR calc Af Amer: 27 mL/min — ABNORMAL LOW (ref >60)
GFR calc non Af Amer: 23 mL/min — ABNORMAL LOW (ref >60)
Glucose, Bld: 367 mg/dL — ABNORMAL HIGH (ref 65–99)
Potassium: 4.1 mmol/L (ref 3.5–5.1)
Sodium: 137 mmol/L (ref 135–145)

## 2017-10-03 LAB — GLUCOSE, CAPILLARY
Glucose-Capillary: 339 mg/dL — ABNORMAL HIGH (ref 65–99)
Glucose-Capillary: 283 mg/dL — ABNORMAL HIGH (ref 65–99)
Glucose-Capillary: 348 mg/dL — ABNORMAL HIGH (ref 65–99)
Glucose-Capillary: 372 mg/dL — ABNORMAL HIGH (ref 65–99)

## 2017-10-03 LAB — PROTIME-INR
INR: 3.04
Prothrombin Time: 31.2 seconds — ABNORMAL HIGH (ref 11.4–15.2)

## 2017-10-03 MED ORDER — SILVER NITRATE-POT NITRATE 75-25 % EX MISC
1.0000 | Freq: Once | CUTANEOUS | Status: DC | PRN
Start: 2017-10-03 — End: 2017-10-04
  Filled 2017-10-03: qty 1

## 2017-10-03 MED ORDER — OXYMETAZOLINE HCL 0.05 % NA SOLN
2.0000 | NASAL | Status: AC
  Filled 2017-10-03: qty 15

## 2017-10-03 MED ORDER — LIDOCAINE HCL 2 % EX GEL
1.0000 "application " | Freq: Once | CUTANEOUS | Status: DC | PRN
  Filled 2017-10-03 (×2): qty 5

## 2017-10-03 MED ORDER — ORAL CARE MOUTH RINSE
15.0000 mL | Freq: Two times a day (BID) | OROMUCOSAL | Status: DC
  Administered 2017-10-03 – 2017-10-04 (×3): 15 mL via OROMUCOSAL

## 2017-10-03 MED ORDER — DOXYCYCLINE HYCLATE 100 MG PO TABS
100.0000 mg | ORAL_TABLET | Freq: Two times a day (BID) | ORAL | Status: DC
  Administered 2017-10-03 – 2017-10-04 (×3): 100 mg via ORAL
  Filled 2017-10-03 (×2): qty 1

## 2017-10-03 MED ORDER — IPRATROPIUM-ALBUTEROL 0.5-2.5 (3) MG/3ML IN SOLN: 3.0000 mL | Freq: Four times a day (QID) | RESPIRATORY_TRACT | Status: DC | PRN

## 2017-10-03 MED ORDER — WARFARIN SODIUM 1 MG PO TABS
1.0000 mg | ORAL_TABLET | Freq: Once | ORAL | Status: AC
  Administered 2017-10-03: 1 mg via ORAL
  Filled 2017-10-03: qty 1

## 2017-10-03 MED ORDER — INSULIN ASPART 100 UNIT/ML ~~LOC~~ SOLN
20.0000 [IU] | Freq: Three times a day (TID) | SUBCUTANEOUS | Status: DC
  Administered 2017-10-03 – 2017-10-04 (×4): 20 [IU] via SUBCUTANEOUS

## 2017-10-03 MED ORDER — OXYMETAZOLINE HCL 0.05 % NA SOLN
1.0000 | Freq: Once | NASAL | Status: DC | PRN
  Filled 2017-10-03: qty 15

## 2017-10-03 MED ORDER — LIDOCAINE HCL 4 % EX SOLN
0.0000 mL | Freq: Once | CUTANEOUS | Status: DC | PRN
  Filled 2017-10-03: qty 50

## 2017-10-03 MED ORDER — CEFDINIR 300 MG PO CAPS
300.0000 mg | ORAL_CAPSULE | Freq: Two times a day (BID) | ORAL | Status: DC
  Filled 2017-10-03: qty 1

## 2017-10-03 MED ORDER — LIDOCAINE-EPINEPHRINE (PF) 1 %-1:200000 IJ SOLN
0.0000 mL | Freq: Once | INTRAMUSCULAR | Status: DC | PRN
  Filled 2017-10-03 (×2): qty 30

## 2017-10-03 MED ORDER — TRIPLE ANTIBIOTIC 3.5-400-5000 EX OINT
1.0000 "application " | TOPICAL_OINTMENT | Freq: Once | CUTANEOUS | Status: DC | PRN
  Filled 2017-10-03 (×2): qty 1

## 2017-10-03 NOTE — Progress Notes (Signed)
  Date: 10/03/2017  Patient name: Seth Dudley  Medical record number: [id number]  Date of birth: March 15, 1951   I have seen and evaluated this patient and I have discussed the plan of care with the house staff. Please see their note for complete details. I concur with their findings with the following additions/corrections:   Breathing and cough are improving. TTE notable yesterday for signs of elevated CVP, diuresed -600 cc, creatinine improved this morning close to baseline. Will resume home diuretic therapy and continue treatment for acute COPD exacerbation, likely due to URI or seasonal allergies.   MS4 Lowell Guitar on our team spoke with his PCP yesterday, who said that when she talked to him, he did not sound like himself. Given his worsening weakness and reported speech changes, MRI brain is indicated. 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  We will discuss this finding with ENT. I suspect outpatient follow up is appropriate, but it does appear to be obstructing his maxillary sinus, is likely impacting his already compromised breathing, and traveling to outpatient follow up may be difficult for him.   Regarding his diabetes and heart failure, he may benefit from addition of an insulin sensitizing medicine. Metformin may have previously been tried and stopped for some reason. A GLP-1 agonist such as liraglutide or semaglutide may provide cardiovascular and weight loss benefits as well.   Jessy Oto, M.D., Ph.D. 10/03/2017, 3:51 PM

## 2017-10-03 NOTE — Progress Notes (Signed)
ANTICOAGULATION CONSULT NOTE -follow up Pharmacy Consult for warfarin Indication: atrial fibrillation  Allergies  Allergen Reactions  . Citalopram Other (See Comments)    Unknown reaction  . Lisinopril Other (See Comments)    Unknown reaction  . Vancomycin Other (See Comments)    Unknown reaction    Patient Measurements: Height:  (185.4 cm) Weight: (!) 424 lb 2.6 oz (192.4 kg) IBW/kg (Calculated) : 79.9 Heparin Dosing Weight: n/a   Vital Signs: Temp: 97.6 F (36.4 C) (05/22 1222) Temp Source: Oral (05/22 1222) BP: 143/80 (05/22 1222) Pulse Rate: 70 (05/22 1222)  Labs: Recent Labs    10/01/17 1459 10/02/17 0557 10/03/17 0504  HGB 13.1 12.3*  --   HCT 44.1 41.4  --   PLT 236 231  --   LABPROT 26.1* 28.2* 31.2*  INR 2.42 2.67 3.04  CREATININE 3.57* 3.26* 2.70*  TROPONINI <0.03  --   --     Estimated Creatinine Clearance: 47.5 mL/min (A) (by C-G formula based on SCr of 2.7 mg/dL (H)).   Medical History: Past Medical History:  Diagnosis Date  . Chronic a-fib (HCC)    a. rate-controlled and on coumadin  . Chronic diastolic CHF (congestive heart failure) (HCC)    a. EF 55%  . Chronic kidney disease, stage III (moderate) (HCC)   . Diabetes mellitus with neuropathy (HCC)   . History of COPD    a. on home O2  . History of CVA (cerebrovascular accident)   . History of DVT (deep vein thrombosis)    a. chronic coumadin  . History of substance abuse   . History of TIA (transient ischemic attack)   . Hyperlipidemia   . Hypertension   . Morbid obesity (HCC)   . Obstructive sleep apnea    a. On CPAP.  Marland Kitchen Pulmonary hypertension (HCC)     Medications:  Medications Prior to Admission  Medication Sig Dispense Refill Last Dose  . albuterol (PROVENTIL HFA;VENTOLIN HFA) 108 (90 Base) MCG/ACT inhaler Inhale 2 puffs into the lungs every 4 (four) hours as needed for wheezing or shortness of breath.   10/01/2017 at am  . albuterol (PROVENTIL) (2.5 MG/3ML) 0.083%  nebulizer solution Take 2.5 mg by nebulization every 6 (six) hours as needed for wheezing or shortness of breath.   09/30/2017 at pm  . allopurinol (ZYLOPRIM) 100 MG tablet Take 200 mg by mouth daily. For gout   10/01/2017 at am  . atorvastatin (LIPITOR) 80 MG tablet Take 80 mg by mouth at bedtime. For cholesterol   09/30/2017 at pm  . budesonide-formoterol (SYMBICORT) 160-4.5 MCG/ACT inhaler Inhale 2 puffs into the lungs 2 (two) times daily.   10/01/2017 at am  . cholecalciferol (VITAMIN D) 1000 UNITS tablet Take 3,000 Units by mouth daily.   10/01/2017 at am  . clotrimazole (LOTRIMIN) 1 % cream Apply 1 application topically 2 (two) times daily as needed (skin irritation - back and legs).    09/27/2017 at Unknown time  . DEXTROSE, DIABETIC USE, PO Take 15 g by mouth See admin instructions. Use 1 tube (15 grams of glucose) by mouth as directed for low blood sugar with symptoms   week ago  . fluticasone (FLONASE) 50 MCG/ACT nasal spray Place 2 sprays into both nostrils daily as needed for allergies or rhinitis (congestion).   week ago  . insulin glargine (LANTUS) 100 UNIT/ML injection Inject 70 Units into the skin 2 (two) times daily.    10/01/2017 at am  . insulin regular (NOVOLIN R,HUMULIN  R) 100 units/mL injection Inject 60 Units into the skin 3 (three) times daily before meals.   10/01/2017 at am  . isosorbide mononitrate (IMDUR) 30 MG 24 hr tablet Take 30 mg by mouth daily.   10/01/2017 at am  . magnesium oxide (MAG-OX) 400 MG tablet Take 400 mg by mouth daily.   10/01/2017 at am  . metolazone (ZAROXOLYN) 2.5 MG tablet Take 2.5 mg by mouth See admin instructions. Take 1 tablet (2.5 mg) by mouth daily for 3 days for weight gain of more then 3 lbs/day or 5 lbs/week   10/01/2017 at am  . metoprolol tartrate (LOPRESSOR) 50 MG tablet Take 50 mg by mouth 2 (two) times daily.   10/01/2017 at 9-10am  . miconazole (MICOTIN) 2 % powder Apply topically 2 (two) times daily. Apply to areas of fungal rash including skin  folds under arms and groin   10/01/2017 at am  . omeprazole (PRILOSEC) 20 MG capsule Take 20 mg by mouth daily before breakfast. Take 30 minutes before meal   10/01/2017 at am  . oxyCODONE-acetaminophen (PERCOCET/ROXICET) 5-325 MG tablet Take 1 tablet by mouth 4 (four) times daily as needed (pain).   10/01/2017 at 300  . polyethylene glycol (MIRALAX / GLYCOLAX) packet Take 17 g by mouth daily as needed (constipation).   unknown  . potassium chloride SA (K-DUR,KLOR-CON) 20 MEQ tablet Take 20 mEq by mouth 2 (two) times daily.   10/01/2017 at am  . sennosides-docusate sodium (SENOKOT-S) 8.6-50 MG tablet Take 3 tablets by mouth 2 (two) times daily.    10/01/2017 at am  . sertraline (ZOLOFT) 100 MG tablet Take 100 mg by mouth daily.   10/01/2017 at am  . Skin Protectants, Misc. (ALOE VESTA PROTECTIVE) OINT Apply 1 application topically 2 (two) times daily. For protection of coccyx   10/01/2017 at am  . sodium chloride (OCEAN) 0.65 % SOLN nasal spray Place 2 sprays into both nostrils 5 (five) times daily as needed for congestion.   unknown  . spironolactone (ALDACTONE) 25 MG tablet Take 25 mg by mouth daily.   10/01/2017 at am  . terazosin (HYTRIN) 2 MG capsule Take 4 mg by mouth at bedtime.   09/30/2017 at pm  . tolnaftate (TINACTIN) 1 % powder Apply 1 application topically daily.   5/19?  Marland Kitchen torsemide (DEMADEX) 20 MG tablet Take 40-60 mg by mouth See admin instructions. Take 3 tablets (60 mg) by mouth every morning and 2 tablets (40 mg) every night - for congestive heart failure   10/01/2017 at am  . triamcinolone cream (KENALOG) 0.1 % Apply 1 application topically See admin instructions. Add 1 teaspoonful to liberal amount of eucerin cream and apply to lower legs before wrapping twice weekly   09/28/2017  . warfarin (COUMADIN) 5 MG tablet Take 2.5-5 mg by mouth See admin instructions. Take 1/2 tablet (2.5 mg) by mouth Monday, Wednesday, Friday at 6pm, take 1 tablet (5 mg) Sunday, Tuesday, Thursday, Saturday at 6pm    09/30/2017 at 1800  . metoprolol (LOPRESSOR) 100 MG tablet Take 1 tablet (100 mg total) by mouth 2 (two) times daily. (Patient not taking: Reported on 10/01/2017) 180 tablet 3 Not Taking at Unknown time    Assessment: 21 YOM with a history of Afib on warfarin here with shortness of breath. Pharmacy consulted to resume home warfarin. INR on admission was therapeutic at 2.42. His last dose of warfarin taken pta was 5/19. Patient has been started on doxycycline which can interact with warfarin to  increase hypoprothrombinemic effect of warfarin.  Today the INR 3.04, therapeutic with slightly above goal.  I lowered the warfarin dose yesterday due to potential for doxycycline to increase warfarin effect.  Meal intake thus far: 75-100%.   Home warfarin regimen: 2.5 mg on M/W/F; 5 mg on all other days   Goal of Therapy:  INR 2-3 Monitor platelets by anticoagulation protocol: Yes   Plan:  Decrease warfarin to 1 mg today x1 (due to probable DDI with doxycycline) Monitor daily PT/INR    Thank you for allowing pharmacy to be part of this patients care team. Noah Delaine, RPh Clinical Pharmacist Pager: [phone] 08:00-15:29: [phone] 15:30-22:15: [phone] After 22:15: Main Pharmacy [phone] 10/03/2017 12:40 PM

## 2017-10-03 NOTE — Consult Note (Signed)
Seth Dudley, Seth Dudley 67 y.o., male [id number]     Chief Complaint: LEFT nasal mass  HPI: MRI earlier today shows LEFT nasal mass coming from middle turbinate or middle meatus with probable post obstructive sinusitis of the LEFT antrum.  Pt aware of LEFT nasal obstruction x 10+ yrs. No pain or bleeding.  Yellow/brown thick mucus with vigorous blowing.  Hx cigarette and cocaine use.    Multiple complicated medical problems including morbid obesity, DM CHF, COPD, OSA, A fib.    PMH: Past Medical History:  Diagnosis Date  . Chronic a-fib (HCC)    a. rate-controlled and on coumadin  . Chronic diastolic CHF (congestive heart failure) (HCC)    a. EF 55%  . Chronic kidney disease, stage III (moderate) (HCC)   . Diabetes mellitus with neuropathy ([place])   . History of COPD    a. on home O2  . History of CVA (cerebrovascular accident)   . History of DVT (deep vein thrombosis)    a. chronic coumadin  . History of substance abuse   . History of TIA (transient ischemic attack)   . Hyperlipidemia   . Hypertension   . Morbid obesity ([place])   . Obstructive sleep apnea    a. On CPAP.  Marland Kitchen Pulmonary hypertension ([place])     Surg JX:BJYNWGN reviewed. No pertinent surgical history.  FHx:   Family History  Problem Relation Age of Onset  . Heart attack Father   . Hyperlipidemia Brother   . Hypertension Brother    SocHx:  reports that he quit smoking about 6 years ago. His smoking use included cigarettes. He has a 107.50 pack-year smoking history. He has quit using smokeless tobacco. He reports that he drank alcohol. He reports that he has current or past drug history. Drug: "Crack" cocaine.  ALLERGIES:  Allergies  Allergen Reactions  . Citalopram Other (See Comments)    Unknown reaction  . Lisinopril Other (See Comments)    Unknown reaction  . Vancomycin Other (See Comments)    Unknown reaction    Medications Prior to Admission  Medication Sig Dispense Refill  . albuterol (PROVENTIL  HFA;VENTOLIN HFA) 108 (90 Base) MCG/ACT inhaler Inhale 2 puffs into the lungs every 4 (four) hours as needed for wheezing or shortness of breath.    Marland Kitchen albuterol (PROVENTIL) (2.5 MG/3ML) 0.083% nebulizer solution Take 2.5 mg by nebulization every 6 (six) hours as needed for wheezing or shortness of breath.    . allopurinol (ZYLOPRIM) 100 MG tablet Take 200 mg by mouth daily. For gout    . atorvastatin (LIPITOR) 80 MG tablet Take 80 mg by mouth at bedtime. For cholesterol    . budesonide-formoterol (SYMBICORT) 160-4.5 MCG/ACT inhaler Inhale 2 puffs into the lungs 2 (two) times daily.    . cholecalciferol (VITAMIN D) 1000 UNITS tablet Take 3,000 Units by mouth daily.    . clotrimazole (LOTRIMIN) 1 % cream Apply 1 application topically 2 (two) times daily as needed (skin irritation - back and legs).     . DEXTROSE, DIABETIC USE, PO Take 15 g by mouth See admin instructions. Use 1 tube (15 grams of glucose) by mouth as directed for low blood sugar with symptoms    . fluticasone (FLONASE) 50 MCG/ACT nasal spray Place 2 sprays into both nostrils daily as needed for allergies or rhinitis (congestion).    . insulin glargine (LANTUS) 100 UNIT/ML injection Inject 70 Units into the skin 2 (two) times daily.     . insulin regular (NOVOLIN  R,HUMULIN R) 100 units/mL injection Inject 60 Units into the skin 3 (three) times daily before meals.    . isosorbide mononitrate (IMDUR) 30 MG 24 hr tablet Take 30 mg by mouth daily.    . magnesium oxide (MAG-OX) 400 MG tablet Take 400 mg by mouth daily.    . metolazone (ZAROXOLYN) 2.5 MG tablet Take 2.5 mg by mouth See admin instructions. Take 1 tablet (2.5 mg) by mouth daily for 3 days for weight gain of more then 3 lbs/day or 5 lbs/week    . metoprolol tartrate (LOPRESSOR) 50 MG tablet Take 50 mg by mouth 2 (two) times daily.    . miconazole (MICOTIN) 2 % powder Apply topically 2 (two) times daily. Apply to areas of fungal rash including skin folds under arms and groin    .  omeprazole (PRILOSEC) 20 MG capsule Take 20 mg by mouth daily before breakfast. Take 30 minutes before meal    . oxyCODONE-acetaminophen (PERCOCET/ROXICET) 5-325 MG tablet Take 1 tablet by mouth 4 (four) times daily as needed (pain).    . polyethylene glycol (MIRALAX / GLYCOLAX) packet Take 17 g by mouth daily as needed (constipation).    . potassium chloride SA (K-DUR,KLOR-CON) 20 MEQ tablet Take 20 mEq by mouth 2 (two) times daily.    . sennosides-docusate sodium (SENOKOT-S) 8.6-50 MG tablet Take 3 tablets by mouth 2 (two) times daily.     . sertraline (ZOLOFT) 100 MG tablet Take 100 mg by mouth daily.    . Skin Protectants, Misc. (ALOE VESTA PROTECTIVE) OINT Apply 1 application topically 2 (two) times daily. For protection of coccyx    . sodium chloride (OCEAN) 0.65 % SOLN nasal spray Place 2 sprays into both nostrils 5 (five) times daily as needed for congestion.    Marland Kitchen spironolactone (ALDACTONE) 25 MG tablet Take 25 mg by mouth daily.    Marland Kitchen terazosin (HYTRIN) 2 MG capsule Take 4 mg by mouth at bedtime.    Marland Kitchen tolnaftate (TINACTIN) 1 % powder Apply 1 application topically daily.    Marland Kitchen torsemide (DEMADEX) 20 MG tablet Take 40-60 mg by mouth See admin instructions. Take 3 tablets (60 mg) by mouth every morning and 2 tablets (40 mg) every night - for congestive heart failure    . triamcinolone cream (KENALOG) 0.1 % Apply 1 application topically See admin instructions. Add 1 teaspoonful to liberal amount of eucerin cream and apply to lower legs before wrapping twice weekly    . warfarin (COUMADIN) 5 MG tablet Take 2.5-5 mg by mouth See admin instructions. Take 1/2 tablet (2.5 mg) by mouth Monday, Wednesday, Friday at 6pm, take 1 tablet (5 mg) Sunday, Tuesday, Thursday, Saturday at 6pm    . metoprolol (LOPRESSOR) 100 MG tablet Take 1 tablet (100 mg total) by mouth 2 (two) times daily. (Patient not taking: Reported on 10/01/2017) 180 tablet 3    Results for orders placed or performed during the hospital  encounter of 10/01/17 (from the past 48 hour(s))  CBG monitoring, ED     Status: Abnormal   Collection Time: 10/01/17  9:12 PM  Result Value Ref Range   Glucose-Capillary 203 (H) 65 - 99 mg/dL  Urinalysis, Routine w reflex microscopic     Status: Abnormal   Collection Time: 10/01/17 11:12 PM  Result Value Ref Range   Color, Urine STRAW (A) YELLOW   APPearance CLEAR CLEAR   Specific Gravity, Urine 1.008 1.005 - 1.030   pH 5.0 5.0 - 8.0   Glucose, UA  NEGATIVE NEGATIVE mg/dL   Hgb urine dipstick NEGATIVE NEGATIVE   Bilirubin Urine NEGATIVE NEGATIVE   Ketones, ur NEGATIVE NEGATIVE mg/dL   Protein, ur NEGATIVE NEGATIVE mg/dL   Nitrite NEGATIVE NEGATIVE   Leukocytes, UA NEGATIVE NEGATIVE    Comment: Performed at [facility] [address], [place], [place] [address]  Strep pneumoniae urinary antigen     Status: None   Collection Time: 10/01/17 11:12 PM  Result Value Ref Range   Strep Pneumo Urinary Antigen NEGATIVE NEGATIVE    Comment:        Infection due to S. pneumoniae cannot be absolutely ruled out since the antigen present may be below the detection limit of the test. Performed at [facility], [address] [place], [place] [address]   Basic metabolic panel     Status: Abnormal   Collection Time: 10/02/17  5:57 AM  Result Value Ref Range   Sodium 135 135 - 145 mmol/L   Potassium 4.1 3.5 - 5.1 mmol/L   Chloride 94 (L) 101 - 111 mmol/L   CO2 28 22 - 32 mmol/L   Glucose, Bld 436 (H) 65 - 99 mg/dL   BUN 92 (H) 6 - 20 mg/dL   Creatinine, Ser 3.26 (H) 0.61 - 1.24 mg/dL   Calcium 9.0 8.9 - 10.3 mg/dL   GFR calc non Af Amer 18 (L) >60 mL/min   GFR calc Af Amer 21 (L) >60 mL/min    Comment: (NOTE) The eGFR has been calculated using the CKD EPI equation. This calculation has not been validated in all clinical situations. eGFR's persistently <60 mL/min signify possible Chronic Kidney Disease.    Anion gap 13 5 - 15    Comment: Performed at [facility] [address], [place], [place] [address]  CBC     Status: Abnormal   Collection Time: 10/02/17  5:57 AM  Result Value Ref Range   WBC 9.3 4.0 - 10.5 K/uL   RBC 4.91 4.22 - 5.81 MIL/uL   Hemoglobin 12.3 (L) 13.0 - 17.0 g/dL    Comment: REPEATED TO VERIFY   HCT 41.4 39.0 - 52.0 %   MCV 84.3 78.0 - 100.0 fL   MCH 25.1 (L) 26.0 - 34.0 pg   MCHC 29.7 (L) 30.0 - 36.0 g/dL   RDW 21.2 (H) 11.5 - 15.5 %   Platelets 231 150 - 400 K/uL    Comment: Performed at [facility], [place] [address], [place], [place] [address]  Protime-INR     Status: Abnormal   Collection Time: 10/02/17  5:57 AM  Result Value Ref Range   Prothrombin Time 28.2 (H) 11.4 - 15.2 seconds   INR 2.67     Comment: Performed at [facility] [address], [place], [place] [address]  Glucose, capillary     Status: Abnormal   Collection Time: 10/02/17  7:34 AM  Result Value Ref Range   Glucose-Capillary 395 (H) 65 - 99 mg/dL   Comment 1 Notify RN   Glucose, capillary     Status: Abnormal   Collection Time: 10/02/17 11:35 AM  Result Value Ref Range   Glucose-Capillary 421 (H) 65 - 99 mg/dL  Glucose, random     Status: Abnormal   Collection Time: 10/02/17 12:19 PM  Result Value Ref Range   Glucose, Bld 378 (H) 65 - 99 mg/dL    Comment: Performed at [facility],  [place] [address]  Glucose, capillary     Status: Abnormal   Collection Time: 10/02/17  4:30 PM  Result Value Ref Range   Glucose-Capillary 350 (H) 65 - 99 mg/dL  Glucose, capillary     Status: Abnormal   Collection Time: 10/02/17  9:29 PM  Result Value Ref Range   Glucose-Capillary 399 (H) 65 - 99 mg/dL  Protime-INR     Status: Abnormal   Collection Time: 10/03/17  5:04 AM  Result Value Ref Range   Prothrombin Time 31.2 (H) 11.4 - 15.2 seconds   INR 3.04     Comment: Performed at [facility], [place] [address] [place], [place] [address]  Basic metabolic panel     Status: Abnormal   Collection Time: 10/03/17   5:04 AM  Result Value Ref Range   Sodium 137 135 - 145 mmol/L   Potassium 4.1 3.5 - 5.1 mmol/L   Chloride 94 (L) 101 - 111 mmol/L   CO2 30 22 - 32 mmol/L   Glucose, Bld 367 (H) 65 - 99 mg/dL   BUN 92 (H) 6 - 20 mg/dL   Creatinine, Ser 2.70 (H) 0.61 - 1.24 mg/dL   Calcium 9.7 8.9 - 10.3 mg/dL   GFR calc non Af Amer 23 (L) >60 mL/min   GFR calc Af Amer 27 (L) >60 mL/min    Comment: (NOTE) The eGFR has been calculated using the CKD EPI equation. This calculation has not been validated in all clinical situations. eGFR's persistently <60 mL/min signify possible Chronic Kidney Disease.    Anion gap 13 5 - 15    Comment: Performed at [facility] [address] [place], [place] [address]  Glucose, capillary     Status: Abnormal   Collection Time: 10/03/17  7:37 AM  Result Value Ref Range   Glucose-Capillary 339 (H) 65 - 99 mg/dL   Comment 1 Notify RN    Comment 2 Document in Chart   Glucose, capillary     Status: Abnormal   Collection Time: 10/03/17 12:18 PM  Result Value Ref Range   Glucose-Capillary 283 (H) 65 - 99 mg/dL   Comment 1 Notify RN    Comment 2 Document in Chart   Glucose, capillary     Status: Abnormal   Collection Time: 10/03/17  5:22 PM  Result Value Ref Range   Glucose-Capillary 348 (H) 65 - 99 mg/dL   Comment 1 Notify RN    Comment 2 Document in Chart    X-ray Chest Pa And Lateral  Result Date: 10/02/2017 CLINICAL DATA:  Shortness of breath EXAM: CHEST - 2 VIEW COMPARISON:  10/01/2017 FINDINGS: Increased interstitial markings, possibly reflecting chronic lung disease, although mild interstitial edema is also possible (although not favored). Mild left basilar opacity, likely atelectasis. No pleural effusions or pneumothorax. Mild cardiomegaly. IMPRESSION: Increased interstitial markings, favoring chronic lung disease, less likely mild interstitial edema. Mild left basilar opacity, likely atelectasis. Electronically Signed   By: Julian Hy M.D.   On:  10/02/2017 09:30   Mr Brain Wo Contrast  Result Date: 10/03/2017 CLINICAL DATA:  Worsening left-sided weakness. Prior stroke with residual left-sided weakness. EXAM: MRI HEAD WITHOUT CONTRAST TECHNIQUE: Multiplanar, multiecho pulse sequences of the brain and surrounding structures were obtained without intravenous contrast. COMPARISON:  Head CT 11/06/2010 and MRI 11/21/2006 FINDINGS: Brain: There is no evidence of acute infarct, mass, midline shift, or extra-axial fluid collection. A chronic microhemorrhage is noted in the left parietal lobe. There is new  mild cerebral atrophy. A small chronic infarct in the right paracentral pons is new. Vascular: Major intracranial vascular flow voids are preserved. Skull and upper cervical spine: Unremarkable bone marrow signal. Sinuses/Orbits: Unremarkable orbits. An approximately 4 cm left nasal cavity mass has enlarged from the prior studies. There is circumferential left maxillary sinus mucosal thickening. The mastoid air cells are clear. Other: None. IMPRESSION: 1. No acute intracranial abnormality. 2. Chronic pontine infarct. 3. Enlarging left nasal cavity mass with chronic left maxillary sinusitis. Direct visualization recommended. Electronically Signed   By: Logan Bores M.D.   On: 10/03/2017 13:36     Blood pressure (!) 143/80, pulse 70, temperature 97.6 F (36.4 C), temperature source Oral, resp. rate 18, height _0  (1.854 m), weight (!) 192.4 kg (424 lb 2.6 oz), SpO2 97 %.  PHYSICAL EXAM: Overall appearance:  Very large.  Head:  NCAT Eyes:  Ptosis OS, reportedly congenital Ears: clear Nose: clear Oral Cavity: bulky tissues.   Oral Pharynx/Hypopharynx/Larynx:  S/p UPPP. 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  No strong suggestion of malignancy.    Plan:  Afrin spray ordered but  never given.  (Epic problem.) CT maxillofacial ordered. Needs to return to my office when stronger for intranasal endoscopy and biopsy.  His overall medical condition may preclude any definitive solution, but a tissue biopsy will help guide decision making.    Jodi Marble 5/91/6384, 7:04 PM

## 2017-10-03 NOTE — Progress Notes (Addendum)
Subjective: Seth Dudley was in better spirits this morning and states that he feels better and have been coughing less. He states that his wife will be picking him up for discharge.  He was encouraged to get refills for his pain medications from his PCP.  Objective:  Vital signs in last 24 hours: Vitals:   10/03/17 0150 10/03/17 0512 10/03/17 1140 10/03/17 1222  BP: 138/70 131/60 132/73 (!) 143/80  Pulse: 77 75 75 70  Resp: _0 Temp: 97.6 F (36.4 C) (!) 97.5 F (36.4 C)  97.6 F (36.4 C)  TempSrc: Oral Oral  Oral  SpO2: 94% 97% 95% 97%  Weight:  (!) 424 lb 2.6 oz (192.4 kg)    Height:       Physical Exam  Constitutional: He is oriented to person, place, and time.  HENT:  Head: Normocephalic and atraumatic.  Cardiovascular: Normal rate.  Exam limited by body habitus  Pulmonary/Chest: Effort normal and breath sounds normal.  No coughing spells during exam  Abdominal: Soft. Bowel sounds are normal.  Obese.  Musculoskeletal:       Right lower leg: Right lower leg edema: Unna boots in place.       Left lower leg: Left lower leg edema: Unna boots in place.  Neurological: He is alert and oriented to person, place, and time.  Skin: Skin is warm and dry.  Psychiatric: He has a normal mood and affect. His behavior is normal.  Nursing note and vitals reviewed.  Lab Results  Component Value Date   INR 2.60 10/04/2017   INR 3.04 10/03/2017   INR 2.67 10/02/2017    MRI Brain 10/03/17: FINDINGS: Brain: There is no evidence of acute infarct, mass, midline shift, or extra-axial fluid collection. A chronic microhemorrhage is noted in the left parietal lobe. There is new mild cerebral atrophy. A small chronic infarct in the right paracentral pons is new.  Vascular: Major intracranial vascular flow voids are preserved.  Skull and upper cervical spine: Unremarkable bone marrow signal.  Sinuses/Orbits: Unremarkable orbits. An approximately 4 cm left nasal cavity  mass has enlarged from the prior studies. There is circumferential left maxillary sinus mucosal thickening. The mastoid air cells are clear.  Other: None.  IMPRESSION: 1. No acute intracranial abnormality. 2. Chronic pontine infarct. 3. Enlarging left nasal cavity mass with chronic left maxillary sinusitis. Direct visualization recommended.   CT Maxillofacial 10/03/17:  COMPARISON:  MRI of the head Oct 03, 2017 and CT HEAD November 06, 2010  FINDINGS: OSSEOUS: The mandible is intact, the condyles are located. No acute facial fracture. No destructive bony lesions. Patient is edentulous.  ORBITS: Ocular globes and orbital contents are normal.  SINUSES: Lobulated soft tissue mass measuring 4 mm was 2.1 cm in 2012. Mass appears to arise from the LEFT inferior turbinate and obstructs the LEFT ostiomeatal unit. Severe LEFT maxillary sinusitis with mucoperiosteal reaction. Hypoplastic LEFT middle turbinate. Bilateral concha lamella. Mild RIGHT maxillary sinus, RIGHT sphenoid sinus mucosal thickening. Patent RIGHT OMC, bilateral sphenoethmoidal recesses and frontal recesses.  SOFT TISSUES: No significant soft tissue swelling. No subcutaneous gas or radiopaque foreign bodies. Moderate calcific atherosclerosis of the carotid bifurcations. 13 mm sebaceous cyst LEFT lower face.  LIMITED INTRACRANIAL: Normal.  IMPRESSION: 1. Slowly enlarging LEFT nasal cavity mass could represent inverted papilloma or polyp. Obstructive chronic LEFT maxillary sinusitis. Recommend NONEMERGENT ENT consultation.   Assessment/Plan:  Principal Problem:   COPD with acute exacerbation (HCC) Active Problems:   Diabetes mellitus  with neuropathy ([place])   Hypertension   Obstructive sleep apnea   Chronic diastolic CHF (congestive heart failure) (HCC)   History of COPD   Nasal cavity mass  #COPD Exacerbation: Positive sick contacts with increased sputum production, oxygen requirement, and shortness of  breath consistent with COPDexacerbation, potentially viral in origin. He remains afebrile and does not have an elevated white blood count.Initial portable chest X-ray was poorly penetrated and revealed mild interstitial densities bilaterally.Repeat CXRwith left basilar atelectasis and chronic changes. He was give IV steroids, started on doxycycline and ceftriaxone in the ED. Elevatedlacticacid of 5.1 on admission, met requirements for code sepsis, now decreased to 2.3. - Sat 95% on 2.5L (home requirement) - 35m po prednisoneqd, ceftriaxone discontinued  - Doxycycline 1032mpo BID - DuoNebs, albuterol, ipratropium - Dulera 2 puffs BID - Tessalon pearls prn for cough; encouraged for sleep only  #DM with neuropathy: Home regimen per patient of 60u regular mealtime (breakfast, 6 PM and 9 PM) and 70u Lantus BID. Resistant SSIand 70u Lantus BID, 20u Novolog with meals. Wound carerecs Tues/Friday schedule. - UrinalysisWNL, negative for S. pneumo - A1c 8.9, uncontrolled - Blood sugars remain elevated, regimen increase close to home doses as above - Percocet [id number] prn for pain  #Nasal Cavity Mass: Mr. TayEffertzmplained intermittently of an inability to move air through his left nostril during this hospitalization, apparently this has been a long standing problem. He has a positive history of remote cocaine use (intranasal administration & smoking).This area was visualized on Brain MRI and he was found to have a 4 cm mass in his left nasal cavity as well as mucosal thickening of the left maxillary sinus. There is no disruption of the orbit visualized. CT with slowly enlarging left nasal cavity mass could represent inverted papilloma or polyp, obstructive chronic left maxillary sinusitis. There is concern for neoplasm, but malignancy is not strongly suggested at this time. ENT consulted and recommends following up as an outpatient for intranasal endoscopy and biopsy.  #Cardiac (CHF, HTN,  Afib): EKG with no acute changes. Continue home warfarin, aldactone, metoprolol, high dose atorvastatin, Imdur. Extremities remain edematous bilaterally. S/p 2L IVF. - Daily PT-INR, pharmacyfollowing; known coumadin interaction with doxycycline - Echocardiogramwith EF >65-70%, Elevated CVP - s/p IV lasix 32m63mO - Torsemide 60mg28mg, holding metolazone  #OSA: Wears CPAP nightly. RT to see.  #s/p CVA:GTRV:UYEBXIDHWYSness bilaterally with marked inability to raise,dorsiflexor plantarflexleft extremity. Unclear if this is residual deficit or new problem. Patient is wheelchair bound at baseline but typically able to ambulate short distances around the house. - PTrecommending SNF if pt fails to progress, pt declined - MRI Brain with chronic changes, no acute findings  #Chronic Medical Conditions: Continue home zoloft, allopurinol, terazosin, protonix.  #PPX:PEG prn, Zofran prn, on warfarin  Dispo: Anticipated discharge in approximately 0 day(s). PT/OT recommending SNF placement, for acute rehab at a minimum though patient is reluctant to go.   PowelLaureen Ochsical Student 10/03/2017, 4:34 PM Pager: [phone] for Student Documentation:  I personally was present and performed or re-performed the history, physical exam and medical decision-making activities of this service and have verified that the service and findings are accurately documented in the student's note.  RaineOda Kilts6/21/2019, 8:39 AM

## 2017-10-03 NOTE — Progress Notes (Signed)
Pt is on his CPAP at this time, pt has own mask.

## 2017-10-03 NOTE — Progress Notes (Signed)
Subjective: Seth Dudley was resting comfortably in bed during rounds this morning. He seemed to be doing a little better, but still suffering from significant coughing spells when he takes deep breaths.  Collateral was obtained from patient's PCP, Whitney Muse (NP) at [phone].  Objective:  Vital signs in last 24 hours: Vitals:   10/02/17 2047 10/02/17 2134 10/03/17 0150 10/03/17 0512  BP:  139/62 138/70 131/60  Pulse:  83 77 75  Resp:  _0 Temp:  97.6 F (36.4 C) 97.6 F (36.4 C) (!) 97.5 F (36.4 C)  TempSrc:  Oral Oral Oral  SpO2: 94% 95% 94% 97%  Weight:    (!) 424 lb 2.6 oz (192.4 kg)  Height:        Physical Exam  Constitutional: He is oriented to person, place, and time. Vital signs are normal. He is easily aroused.  Coughing intermittently. Cracker crumbs observed around lips & chin. CPAP at bedside.  HENT:  Head: Normocephalic and atraumatic.  Eyes:  Left sided ptosis  Neck: JVD: exam limited d/t body habitus.  Cardiovascular: Normal rate and regular rhythm.  Pulmonary/Chest: Effort normal. He has rhonchi.  Abdominal: Soft. Bowel sounds are normal.  obese  Musculoskeletal:       Left hip: He exhibits decreased strength.       Left ankle: He exhibits decreased range of motion.  Neurological: He is alert, oriented to person, place, and time and easily aroused.  Skin: Skin is warm and dry. Capillary refill takes 2 to 3 seconds.  BLE with clean, dry, intact dressings  Psychiatric: He has a normal mood and affect. His behavior is normal. Judgment and thought content normal.  Nursing note and vitals reviewed.  BMP Latest Ref Rng & Units 10/03/2017 10/02/2017 10/02/2017  Glucose 65 - 99 mg/dL 367(H) 378(H) 436(H)  BUN 6 - 20 mg/dL 92(H) - 92(H)  Creatinine 0.61 - 1.24 mg/dL 2.70(H) - 3.26(H)  Sodium 135 - 145 mmol/L 137 - 135  Potassium 3.5 - 5.1 mmol/L 4.1 - 4.1  Chloride 101 - 111 mmol/L 94(L) - 94(L)  CO2 22 - 32 mmol/L 30 - 28  Calcium 8.9 - 10.3  mg/dL 9.7 - 9.0     Assessment/Plan:  Principal Problem:   COPD with acute exacerbation (HCC) Active Problems:   Diabetes mellitus with neuropathy (HCC)   Hypertension   Obstructive sleep apnea   Chronic diastolic CHF (congestive heart failure) (HCC)   History of COPD  #COPD Exacerbation: Positive sick contacts with increased sputum production, oxygen requirement, and shortness of breath consistent with COPD exacerbation, potentially viral in origin. He remains afebrile and does not have an elevated white blood count. Initial portable chest X-ray was poorly penetrated and revealed mild interstitial densities bilaterally. He was started on doxycycline and ceftriaxone in the ED. Elevated lactic acid of 5.1 on admission, met requirements for code sepsis, now decreased to 2.3. - Sat 94%, weaned from 5 to 3L - Repeat CXR with left basilar atelectasis and chronic changes - s/p 146m IV solumedrol - 690mpo prednisone qd, ceftriaxone discontinued  - Doxycycline 1002mo BID - DuoNebs, albuterol, ipratropium - Dulera 2 puffs BID - Tessalon pearls prn for cough; encouraged for sleep only  #DM with neuropathy: Home regimen per patient of 60u regular mealtime (breakfast, 6 PM and 9 PM) and 70u Lantus BID. Resistant SSI and 70u Lantus BID, 20u Novolog with meals. Wound care recs Tues/Friday schedule. - Urinalysis WNL, negative for S. pneumo -  A1c 8.9, uncontrolled - Blood sugars remain elevated, regimen increase close to home doses as above - Percocet 5-341m q6h prn for pain  #Cardiac (CHF, HTN, Afib): EKG with no acute changes. Continue home warfarin, aldactone, metoprolol, high dose atorvastatin, Imdur. Extremities remain edematous bilaterally. S/p 2L IVF. - Daily PT-INR, pharmacy following - INR supratherapeutic (2/2 doxycycline), will hold tonight's dose and resume tomorrow - Echocardiogram with EF >65-70%, Elevated CVP - s/p IV lasix 470mOTO - Torsemide 6021m0mg, holding  metolazone  #OSA: Wears CPAP nightly. RT to see.  #s/p CVAFKV:QOHCOBTVMTNakness bilaterally with marked inability to raise, dorsiflex or plantarflex left extremity. Unclear if this is residual deficit or new problem. Patient is wheelchair bound at baseline but typically able to ambulate short distances around the house. - PT recommending SNF if pt fails to progress, pt declined - MRI Brain pending  #Chronic Medical Conditions: Continue home zoloft, allopurinol, terazosin, protonix.  #PPX:PEG prn, Zofran prn, on warfarin  Dispo: Anticipated discharge in approximately 0 day(s). PT/OT recommending SNF placement, for acute rehab at a minimum though patient is reluctant to go.  PowLaureen Ochsedical Student 10/03/2017, 9:42 AM Pager: [phone] for Student Documentation:  I personally was present and performed or re-performed the history, physical exam and medical decision-making activities of this service and have verified that the service and findings are accurately documented in the student's note.  AmiLorella NimrodD 10/03/2017, 1:07 PM

## 2017-10-03 NOTE — Discharge Summary (Signed)
Name: Seth Dudley MRN: [id number] DOB: 1950/10/17 67 y.o. PCP: Center, [facility] [place], [place] -- [phone])  Date of Admission: 10/01/2017  2:59 PM Date of Discharge: 10/04/17 Attending Physician: Anne Shutter, MD  Discharge Diagnosis: COPD Exacerbation Nasal Cavity Mass  Principal Problem:   COPD with acute exacerbation [facility]) Active Problems:   Diabetes mellitus with neuropathy (HCC)   Hypertension   Obstructive sleep apnea   Chronic diastolic CHF (congestive heart failure) (HCC)   History of COPD   Nasal cavity mass  Discharge Medications: Allergies as of 10/04/2017      Reactions   Citalopram Other (See Comments)   Unknown reaction   Lisinopril Other (See Comments)   Unknown reaction   Vancomycin Other (See Comments)   Unknown reaction      Medication List    TAKE these medications   albuterol 108 (90 Base) MCG/ACT inhaler Commonly known as:  PROVENTIL HFA;VENTOLIN HFA Inhale 2 puffs into the lungs every 4 (four) hours as needed for wheezing or shortness of breath.   albuterol (2.5 MG/3ML) 0.083% nebulizer solution Commonly known as:  PROVENTIL Take 2.5 mg by nebulization every 6 (six) hours as needed for wheezing or shortness of breath.   allopurinol 100 MG tablet Commonly known as:  ZYLOPRIM Take 200 mg by mouth daily. For gout   ALOE VESTA PROTECTIVE Oint Apply 1 application topically 2 (two) times daily. For protection of coccyx   atorvastatin 80 MG tablet Commonly known as:  LIPITOR Take 80 mg by mouth at bedtime. For cholesterol   benzonatate 200 MG capsule Commonly known as:  TESSALON Take 1 capsule (200 mg total) by mouth 3 (three) times daily as needed for cough.   budesonide-formoterol 160-4.5 MCG/ACT inhaler Commonly known as:  SYMBICORT Inhale 2 puffs into the lungs 2 (two) times daily.   cholecalciferol 1000 units tablet Commonly known as:  VITAMIN D Take 3,000 Units by mouth daily.   clotrimazole 1 %  cream Commonly known as:  LOTRIMIN Apply 1 application topically 2 (two) times daily as needed (skin irritation - back and legs).   DEXTROSE (DIABETIC USE) PO Take 15 g by mouth See admin instructions. Use 1 tube (15 grams of glucose) by mouth as directed for low blood sugar with symptoms   doxycycline 100 MG tablet Commonly known as:  VIBRA-TABS Take 1 tablet (100 mg total) by mouth every 12 (twelve) hours for 5 doses. Take only 2.5mg  warfarin on days that you take this medication, then resume your usual schedule.   fluticasone 50 MCG/ACT nasal spray Commonly known as:  FLONASE Place 2 sprays into both nostrils daily as needed for allergies or rhinitis (congestion).   insulin glargine 100 UNIT/ML injection Commonly known as:  LANTUS Inject 70 Units into the skin 2 (two) times daily.   insulin regular 100 units/mL injection Commonly known as:  NOVOLIN R,HUMULIN R Inject 60 Units into the skin 3 (three) times daily before meals.   isosorbide mononitrate 30 MG 24 hr tablet Commonly known as:  IMDUR Take 30 mg by mouth daily.   magnesium oxide 400 MG tablet Commonly known as:  MAG-OX Take 400 mg by mouth daily.   metolazone 2.5 MG tablet Commonly known as:  ZAROXOLYN Take 2.5 mg by mouth See admin instructions. Take 1 tablet (2.5 mg) by mouth daily for 3 days for weight gain of more then 3 lbs/day or 5 lbs/week   metoprolol tartrate 50 MG tablet Commonly known as:  LOPRESSOR Take 50 mg by mouth 2 (two) times daily.   metoprolol tartrate 100 MG tablet Commonly known as:  LOPRESSOR Take 1 tablet (100 mg total) by mouth 2 (two) times daily.   miconazole 2 % powder Commonly known as:  MICOTIN Apply topically 2 (two) times daily. Apply to areas of fungal rash including skin folds under arms and groin   omeprazole 20 MG capsule Commonly known as:  PRILOSEC Take 20 mg by mouth daily before breakfast. Take 30 minutes before meal   oxyCODONE-acetaminophen 5-325 MG  tablet Commonly known as:  PERCOCET/ROXICET Take 1 tablet by mouth 4 (four) times daily as needed (pain).   polyethylene glycol packet Commonly known as:  MIRALAX / GLYCOLAX Take 17 g by mouth daily as needed (constipation).   potassium chloride SA 20 MEQ tablet Commonly known as:  K-DUR,KLOR-CON Take 20 mEq by mouth 2 (two) times daily.   predniSONE 20 MG tablet Commonly known as:  DELTASONE Take 3 tablets (60 mg total) by mouth daily with breakfast for 2 doses. Start taking on:  10/05/2017   sennosides-docusate sodium 8.6-50 MG tablet Commonly known as:  SENOKOT-S Take 3 tablets by mouth 2 (two) times daily.   sertraline 100 MG tablet Commonly known as:  ZOLOFT Take 100 mg by mouth daily.   sodium chloride 0.65 % Soln nasal spray Commonly known as:  OCEAN Place 2 sprays into both nostrils 5 (five) times daily as needed for congestion.   spironolactone 25 MG tablet Commonly known as:  ALDACTONE Take 25 mg by mouth daily.   terazosin 2 MG capsule Commonly known as:  HYTRIN Take 4 mg by mouth at bedtime.   tolnaftate 1 % powder Commonly known as:  TINACTIN Apply 1 application topically daily.   torsemide 20 MG tablet Commonly known as:  DEMADEX Take 40-60 mg by mouth See admin instructions. Take 3 tablets (60 mg) by mouth every morning and 2 tablets (40 mg) every night - for congestive heart failure   triamcinolone cream 0.1 % Commonly known as:  KENALOG Apply 1 application topically See admin instructions. Add 1 teaspoonful to liberal amount of eucerin cream and apply to lower legs before wrapping twice weekly   warfarin 5 MG tablet Commonly known as:  COUMADIN Take 2.5-5 mg by mouth See admin instructions. Take 1/2 tablet (2.5 mg) by mouth Monday, Wednesday, Friday at 6pm, take 1 tablet (5 mg) Sunday, Tuesday, Thursday, Saturday at 6pm       Disposition and follow-up:   Mr.Seth Dudley was discharged from [facility] in Stable  condition.  At the hospital follow up visit please address:  1. Medication changes: Hold warfarin 5/22, then resume normal dosing schedule due to interaction with doxycycline. Tessalon pearls as needed for cough. Consider GLP-1 agonist such as liraglutide or dulaglutide for his T2DM. Offers weight loss, renal and CV protection, and does not require renal dose adjustment.  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 Recommend outpatient follow-up for endoscopy and biopsy, practice information below. Symptomatic management with Afrin if effective.  Intensive physical therapy suggested, patient is at increased falls risk (and on warfarin). Patient declining SNF placement, though requiring 24H assistance.  2.  Labs / imaging needed at time of follow-up: PT-INR  3.  Pending labs/ test needing follow-up: left nasal cavity mass on MRI, ENT f/u recommended  Follow-up Appointments: Ear, Nose & Throat Follow-up Information    Care, [facility] Follow up.  Why:  They will continue to do your home health care at your home Contact information: [address] [place] [address] [phone]        Center, [facility]. Schedule an appointment as soon as possible for a visit in 1 week(s).   Specialty:  General Practice Why:  Call Veleta Miners to make an appointment to review your hospitalization. Contact information: [address] [place] [address] [phone]        Flo Shanks, MD Follow up.   Specialty:  Otolaryngology Why:  Needs to return to my office when stronger for intranasal endoscopy and biopsy.  Contact information: [address] [place] [address] [phone]           Hospital Course by problem list: Principal Problem:   COPD with acute exacerbation (HCC) Active Problems:   Diabetes mellitus with neuropathy (HCC)   Hypertension   Obstructive sleep apnea   Chronic diastolic CHF (congestive heart  failure) (HCC)   History of COPD   Nasal cavity mass   Mr. Luczak is a 41 yoM ex-smoker with a PMH of CHF, COPD requiring home oxygen, morbid obesity, HTN, DM c/b neuropathy, HLD, CKD-3, OSA requiring CPAP, CVA, pulmonary HTN, chronic AF on warfarin who presented to the ED with s/sx c/f CHF exacerbation.  #COPD Exacerbation: Mr. Malkiewicz presented to the ED with generalized weakness, positive sick contacts with increased sputum production, change in sputum color, increased oxygen requirement, and shortness of breath consistent with COPDexacerbation. He remained afebrile and had a normal WBC count for the duration of this admission. Elevated lactate (5.1) resolved with fluid resuscitation. Chest X-rays revealed mild interstitial densities bilaterally.He was started on doxycycline and ceftriaxone in the ED, but this was narrowed to doxycycline  oral BID in anticipation of discharge. His oxygen requirement decreased to 3L with supportive therapies including steroids, DuoNebs, Dulera, ipratroprium and albuterol. Cough suppressants were provided as needed, but continue to encouraged to use for sleep only.  #Nasal Cavity Mass: Mr. Lesesne complained intermittently of an inability to move air through his left nostril during this hospitalization. This area was visualized on Brain MRI and he was found to have a 4 cm mass in his left nasal cavity as well as mucosal thickening of the left maxillary sinus. There is no disruption of the orbit visualized. ENT recommends following up as an outpatient for diagnostic evaluation and treatment.  #DM with neuropathy: He was maintained this admission on resistant SSIand 70u Lantus BID with 20u Novolog with meals. Okay to resume home regimen at discharge (60u regular TID prandially, 70u Lantus BID). Hemoglobin was 8.9 on admission, evidence of uncontrolled blood sugars and consistent with an average glucose level of 209. Twice weekly wound care continued while admitted,  and recommended at discharge for bilateral lower extremities.  #Cardiac (CHF, HTN, Afib): Mr. Stefanko EKG was without acute changes on admission. He was continued on home aldactone, metoprolol, high dose atorvastatin, torsemide, Imdur. Metolazone was held during this admission. INR followed daily by inpatient pharmacy, warfarin decreased due to potentiation by doxycycline. Patient will on dose on day of discharge then resume home regimen 10/04/17. Echocardiogram revealed HFpEF with and EF of 65-70% and increase CVP (study conclusions below). Extremities remained edematous bilaterally, IV fluids used judiciously to avoid volume overload.  #s/p CVA:Mr. Theisen presented with generalized weakness bilaterally with marked inability to raise,dorsiflexor plantarflexleft extremity. Collateral obtained on day of discharge revealed this is not consistent with patient's baseline. MRI  Brain showed no acute intracranial abnormality but chronic changes as detailed below. His weakness is likely secondary to acute illness and deconditioning. Physical therapy evaluation recommended placement at SNF, at least for acute rehabilitation but patient declined due to previously held beliefs about "rest homes." He will be encouraged to resume intensive PT with his home health team.  #Chronic Medical Conditions: Mr. Paci home CPAP, zoloft, allopurinol, terazosin, protonix were continued.  Discharge Vitals:   BP (!) 88/56 (BP Location: Right Arm)   Pulse 68   Temp 97.8 F (36.6 C) (Oral)   Resp 18   Ht  (1.854 m)   Wt (!) 419 lb 5 oz (190.2 kg)   SpO2 91%   BMI 55.32 kg/m   Pertinent Labs, Studies, and Procedures:  Brain MRI 10/03/2017: A chronic microhemorrhage is noted in the left parietal lobe. There is new mild cerebral atrophy. A small chronic infarct in the right paracentral pons is new. An approximately 4 cm left nasal cavity mass has enlarged from the prior studies. There is circumferential left  maxillary sinus mucosal thickening. Direct visualization recommended.  COMPARISON CT Head 12/03/2008: There is a soft tissue attenuating masslike area within the left nasal cavity which may represent a mucous ball versus a polyp. Recommend direct visualization.   CXR 10/03/2017: Increased interstitial markings, favoring chronic lung disease, less likely mild interstitial edema. Mild left basilar opacity, likely atelectasis.  Echocardiogram 10/01/2017: Left ventricle: The cavity size was normal. Wall thickness was increased in a pattern of mild LVH. Systolic function was vigorous. The estimated ejection fraction was in the range of 65% to 70%. Wall motion was normal; there were no regional wall motion abnormalities. There was no evidence of elevated ventricular filling pressure by Doppler parameters.  Lab Results  Component Value Date   CREATININE 2.70 (H) 10/03/2017   CREATININE 3.26 (H) 10/02/2017   CREATININE 3.57 (H) 10/01/2017   Lab Results  Component Value Date   INR 2.60 10/04/2017   INR 3.04 10/03/2017   INR 2.67 10/02/2017    Discharge Instructions: Discharge Instructions    (HEART FAILURE PATIENTS) Call MD:  Anytime you have any of the following symptoms: 1) 3 pound weight gain in 24 hours or 5 pounds in 1 week 2) shortness of breath, with or without a dry hacking cough 3) swelling in the hands, feet or stomach 4) if you have to sleep on extra pillows at night in order to breathe.   Complete by:  As directed    Diet - low sodium heart healthy   Complete by:  As directed    Discharge instructions   Complete by:  As directed    It was a pleasure taking care of you, Mr. Liuzzi.  You were admitted for signs and symptoms of COPD exacerbation or flare. You were treated with inhalers, steroids and antibiotics. You will need to finish your antibiotics at home. Take two more days of steroids (in the AM, with breakfast). You have 5 more doses of you antibiotic, doxycycline. Take this pill  twice daily, 12 hours apart, starting the evening of your discharge. On days when you take the antibiotic (5/23, 5/24 and 5/25), take only 2.5mg  of your warfarin. Then resume your home dosing regimen.  You showed significant weakness during this admission that is unusual for you. Our physical therapists recommended a short stay in a facility where you could get 24 hour assistance, but you have chosen to go home. We would encourage your home health  physical therapist who sees you regularly through the [place] work with you more intensely over the next few weeks to build your strength back.  We scanned your head during this admission with an MRI and a CT. A 4 cm mass was found in your nasal cavity. This obstruction is likely causing what you feel to be nasal congestion. It will be important to follow up with an Ear, Nose and Throat doctor in the outpatient setting to evaluate what this might be. Our physicians have stated that they would like to perform a biopsy, where they take a small piece of tissue to analyze it under a microscope.   Increase activity slowly   Complete by:  As directed       Signed: Rozann Lesches, MD 10/04/2017, 2:40 PM   Pager: [phone]

## 2017-10-04 ENCOUNTER — Encounter (HOSPITAL_COMMUNITY): Payer: Self-pay

## 2017-10-04 DIAGNOSIS — Z881 Allergy status to other antibiotic agents status: Secondary | ICD-10-CM

## 2017-10-04 DIAGNOSIS — Z9981 Dependence on supplemental oxygen: Secondary | ICD-10-CM

## 2017-10-04 DIAGNOSIS — I482 Chronic atrial fibrillation: Secondary | ICD-10-CM

## 2017-10-04 DIAGNOSIS — Z87891 Personal history of nicotine dependence: Secondary | ICD-10-CM

## 2017-10-04 DIAGNOSIS — Z888 Allergy status to other drugs, medicaments and biological substances status: Secondary | ICD-10-CM

## 2017-10-04 DIAGNOSIS — Z6841 Body Mass Index (BMI) 40.0 and over, adult: Secondary | ICD-10-CM

## 2017-10-04 LAB — GLUCOSE, CAPILLARY
Glucose-Capillary: 299 mg/dL — ABNORMAL HIGH (ref 65–99)
Glucose-Capillary: 321 mg/dL — ABNORMAL HIGH (ref 65–99)

## 2017-10-04 LAB — PROTIME-INR
INR: 2.6
Prothrombin Time: 27.6 seconds — ABNORMAL HIGH (ref 11.4–15.2)

## 2017-10-04 MED ORDER — WARFARIN SODIUM 2 MG PO TABS: 2.0000 mg | ORAL_TABLET | Freq: Once | ORAL | Status: DC

## 2017-10-04 MED ORDER — BENZONATATE 200 MG PO CAPS: 200.0000 mg | ORAL_CAPSULE | Freq: Three times a day (TID) | ORAL | 0 refills | Status: AC | PRN

## 2017-10-04 MED ORDER — PREDNISONE 20 MG PO TABS: 60.0000 mg | ORAL_TABLET | Freq: Every day | ORAL | 0 refills | Status: AC

## 2017-10-04 MED ORDER — DOXYCYCLINE HYCLATE 100 MG PO TABS: 100.0000 mg | ORAL_TABLET | Freq: Two times a day (BID) | ORAL | 0 refills | Status: AC

## 2017-10-04 NOTE — Progress Notes (Addendum)
Inpatient Diabetes Program Recommendations  AACE/ADA: New Consensus Statement on Inpatient Glycemic Control (2019)  Target Ranges:  Prepandial:   less than 140 mg/dL      Peak postprandial:   less than 180 mg/dL (1-2 hours)      Critically ill patients:  140 - 180 mg/dL   Results for MACARI, ZALESKY (MRN [id number]) as of 10/04/2017 11:32  Ref. Range 10/03/2017 07:37 10/03/2017 12:18 10/03/2017 17:22 10/03/2017 21:13 10/04/2017 07:50  Glucose-Capillary Latest Ref Range: 65 - 99 mg/dL 308 (H) 657 (H) 846 (H) 372 (H) 299 (H)   Results for ALISTER, STAVER (MRN [id number]) as of 10/02/2017 10:18  Ref. Range 10/01/2017 18:38  Hemoglobin A1C Latest Ref Range: 4.8 - 5.6 % 8.9 (H)   Review of Glycemic Control  Diabetes history: DM2 Outpatient Diabetes medications: Lantus 70 units BID, Regular 60 units TID with meals Current orders for Inpatient glycemic control: Lantus 60 units BID, Novolog 0-20 units TID with meals, Novolog 0-5 units QHS; Novolog 20 units TID with meals, Prednisone 60 mg QAM  Inpatient Diabetes Program Recommendations: Insulin - Basal: Please consider increasing Lantus to 70 units BID. Insulin-Meal Coverage: Please consider increasing meal coverage to Novolog 25 units TID with meals.  Thanks, Orlando Penner, RN, MSN, CDE Diabetes Coordinator Inpatient Diabetes Program [phone] (Team Pager from 8am to 5pm)

## 2017-10-04 NOTE — Progress Notes (Signed)
Pt's wife states she will drive pt home once he's discharged. Pt's wife states she will bring pt's oxygen tank from home for travel.

## 2017-10-04 NOTE — Progress Notes (Signed)
Pt discharge instructions reviewed with pt and wife. Pt and wife verbalize understanding and state they have no questions. Pt belongings with pt. Pt is not in distress. Pt's wife forgot pt's home oxygen and states she will have a friend bring it here.

## 2017-10-04 NOTE — Clinical Social Work Note (Signed)
Clinical Social Work Assessment  Patient Details  Name: Lexington Krotz MRN: [id number] Date of Birth: May 02, 1951  Date of referral:  10/04/17               Reason for consult:  Facility Placement, Discharge Planning                Permission sought to share information with:    Permission granted to share information::  No  Name::        Agency::     Relationship::     Contact Information:     Housing/Transportation Living arrangements for the past 2 months:  Single Family Home Source of Information:  Patient, Medical Team Patient Interpreter Needed:  None Criminal Activity/Legal Involvement Pertinent to Current Situation/Hospitalization:  No - Comment as needed Significant Relationships:  Spouse Lives with:  Spouse Do you feel safe going back to the place where you live?  Yes Need for family participation in patient care:  Yes (Comment)  Care giving concerns:  PT recommending SNF once medically stable for discharge.   Social Worker assessment / plan:  CSW met with patient. No supports at bedside. CSW introduced role and explained that PT recommendations would be discussed. Patient does not want SNF placement but is agreeable to home health. He is already working with [employer] and has someone come daily for 3 hours from [place]. RNCM notified. Patient stated he feels safe returning home. He is working on applying for [employer]. No further concerns. CSW signing off as social work intervention is no longer needed.  Employment status:  Retired Forensic scientist:  Medicare PT Recommendations:  [place] / Referral to community resources:  [place]  Patient/Family's Response to care:  Patient wants to return home at discharge. Patient's wife supportive and involved in patient's care. Patient appreciated social work intervention.  Patient/Family's Understanding of and Emotional Response to Diagnosis, Current Treatment, and Prognosis:   Patient has a good understanding of the reason for admission and his need for continued therapy after discharge. Patient appears happy with hospital care.  Emotional Assessment Appearance:  Appears stated age Attitude/Demeanor/Rapport:  Engaged, Gracious Affect (typically observed):  Appropriate, Calm, Pleasant Orientation:  Oriented to Self, Oriented to Place, Oriented to  Time, Oriented to Situation Alcohol / Substance use:  Never Used Psych involvement (Current and /or in the community):  No (Comment)  Discharge Needs  Concerns to be addressed:  Care Coordination Readmission within the last 30 days:  No Current discharge risk:  Dependent with Mobility Barriers to Discharge:  Continued Medical Work up   Candie Chroman, LCSW 10/04/2017, 11:56 AM

## 2017-10-04 NOTE — Progress Notes (Signed)
Patient placed himself on CPAP at this time, tolerating well no distress noted.

## 2017-10-04 NOTE — Progress Notes (Signed)
Pt is refusing SNF and transport to home. RN attempted to call pt's wife to inform her that staff can wheelchair pt to their vehicle, but that we cannot assist him into the vehicle due to protocol. Pt's wife did not answer call and there is no voicemail option. RN will attempt to call pt's wife at later time.

## 2017-10-04 NOTE — Plan of Care (Signed)
  Problem: Health Behavior/Discharge Planning: Goal: Ability to manage health-related needs will improve Outcome: Progressing   Problem: Clinical Measurements: Goal: Ability to maintain clinical measurements within normal limits will improve Outcome: Progressing   

## 2017-10-04 NOTE — Progress Notes (Signed)
ANTICOAGULATION CONSULT NOTE  Pharmacy Consult for warfarin Indication: atrial fibrillation  Allergies  Allergen Reactions  . Citalopram Other (See Comments)    Unknown reaction  . Lisinopril Other (See Comments)    Unknown reaction  . Vancomycin Other (See Comments)    Unknown reaction   Patient Measurements: Height:  (185.4 cm) Weight: (!) 419 lb 5 oz (190.2 kg) IBW/kg (Calculated) : 79.9 Heparin Dosing Weight: n/a   Vital Signs: Temp: 97.8 F (36.6 C) (05/23 1126) Temp Source: Oral (05/23 1126) BP: 88/56 (05/23 1126) Pulse Rate: 68 (05/23 1126)  Labs: Recent Labs    10/02/17 0557 10/03/17 0504 10/04/17 0427  HGB 12.3*  --   --   HCT 41.4  --   --   PLT 231  --   --   LABPROT 28.2* 31.2* 27.6*  INR 2.67 3.04 2.60  CREATININE 3.26* 2.70*  --    Estimated Creatinine Clearance: 47.2 mL/min (A) (by C-G formula based on SCr of 2.7 mg/dL (H)).  Medical History: Past Medical History:  Diagnosis Date  . Chronic a-fib (HCC)    a. rate-controlled and on coumadin  . Chronic diastolic CHF (congestive heart failure) (HCC)    a. EF 55%  . Chronic kidney disease, stage III (moderate) (HCC)   . Diabetes mellitus with neuropathy (HCC)   . History of COPD    a. on home O2  . History of CVA (cerebrovascular accident)   . History of DVT (deep vein thrombosis)    a. chronic coumadin  . History of substance abuse   . History of TIA (transient ischemic attack)   . Hyperlipidemia   . Hypertension   . Morbid obesity (HCC)   . Obstructive sleep apnea    a. On CPAP.  Marland Kitchen Pulmonary hypertension (HCC)    Assessment: 22 YOM with a history of Afib on warfarin here with shortness of breath. Pharmacy consulted to resume home warfarin. INR today is therapeutic at 2.6.  He received lower dose of Warfarin yesterday due to rise in his INR.  Patient on doxycycline which can interact with warfarin to increase hypoprothrombinemic effect of warfarin.   Home warfarin regimen: 2.5 mg on  M/W/F; 5 mg on all other days   Goal of Therapy:  INR 2-3 Monitor platelets by anticoagulation protocol: Yes   Plan:  Give Warfarin to 2 mg today x1  Monitor daily PT/INR   Nadara Mustard, PharmD., MS Clinical Pharmacist Pager:  [phone] Thank you for allowing pharmacy to be part of this patients care team. 10/04/2017 3:22 PM

## 2017-10-04 NOTE — Progress Notes (Signed)
Physical Therapy Treatment Patient Details Name: Seth Dudley MRN: [id number] DOB: [birth year] Today's Date: 10/04/2017    History of Present Illness Seth Dudley is a 3 yoM ex-smoker with a PMH of CHF, COPD requiring home oxygen, morbid obesity, HTN, DM c/b neuropathy, HLD, CKD-3, OSA requiring CPAP, CVA, pulmonary HTN, chronic AF on warfarin who presented to the ED for worsening cough, dyspnea on exertion and generalized weakness.    PT Comments    Session focused on transfers from bed to chair, discussion of safety. Patient becomes SOB and desats on 2L to low 80's with standing. Pt with improvement in bed mobility this visit, however still requires physical assistance for standing. Discussed patients increased risk of falling, pt adamantly denying SNF at this time and reports he will have support at home. No family or caregivers present to discuss safety with. Still strongly recommending SNF at this time.    Follow Up Recommendations  SNF;Supervision/Assistance - 24 hour     Equipment Recommendations  None recommended by PT    Recommendations for Other Services       Precautions / Restrictions Precautions Precautions: Fall Restrictions Weight Bearing Restrictions: No    Mobility  Bed Mobility Overal bed mobility: Needs Assistance Bed Mobility: Supine to Sit     Supine to sit: Supervision     General bed mobility comments: on air bed, air deflated.   Transfers Overall transfer level: Needs assistance Equipment used: Rolling walker (2 wheeled) Transfers: Sit to/from [employer] Sit to Stand: Min assist;+2 physical assistance Stand pivot transfers: Min assist;+2 physical assistance       General transfer comment: bed raised, pt unable to stand without physical assistance.  Ambulation/Gait             General Gait Details: unable this date due to +SOB and generalized weakness   Stairs             Wheelchair Mobility     Modified Rankin (Stroke Patients Only)       Balance Overall balance assessment: Needs assistance Sitting-balance support: Feet supported;No upper extremity supported Sitting balance-Leahy Scale: Good       Standing balance-Leahy Scale: Poor Standing balance comment: dependent on RW                            Cognition Arousal/Alertness: Awake/alert Behavior During Therapy: WFL for tasks assessed/performed Overall Cognitive Status: Within Functional Limits for tasks assessed                                        Exercises General Exercises - Lower Extremity Long Arc Quad: AROM;Both;20 reps;Seated    General Comments        Pertinent Vitals/Pain      Home Living                      Prior Function            PT Goals (current goals can now be found in the care plan section) Acute Rehab PT Goals Patient Stated Goal: breathe better PT Goal Formulation: With patient Time For Goal Achievement: 10/16/17 Potential to Achieve Goals: Fair Progress towards PT goals: Progressing toward goals    Frequency    Min 3X/week      PT Plan Current plan remains appropriate    Co-evaluation  AM-PAC PT "6 Clicks" Daily Activity  Outcome Measure  Difficulty turning over in bed (including adjusting bedclothes, sheets and blankets)?: Unable Difficulty moving from lying on back to sitting on the side of the bed? : Unable Difficulty sitting down on and standing up from a chair with arms (e.g., wheelchair, bedside commode, etc,.)?: Unable Help needed moving to and from a bed to chair (including a wheelchair)?: A Lot Help needed walking in hospital room?: A Lot Help needed climbing 3-5 steps with a railing? : Total 6 Click Score: 8    End of Session Equipment Utilized During Treatment: Oxygen Activity Tolerance: Patient limited by fatigue Patient left: in chair;with call bell/phone within reach Nurse  Communication: Mobility status PT Visit Diagnosis: Unsteadiness on feet (R26.81)     Time: 4098-1191 PT Time Calculation (min) (ACUTE ONLY): 23 min  Charges:  $Therapeutic Activity: 8-22 mins                    G Codes:       Etta Grandchild, PT, DPT Acute Rehab Services Pager: [phone]     Etta Grandchild 10/04/2017, 10:41 AM

## 2017-10-04 NOTE — Care Management Note (Signed)
Case Management Note  Patient Details  Name: Seth Dudley MRN: [id number] Date of Birth: 07/19/1950  Subjective/Objective:  COPD               Action/Plan: Patient lives alone; PCP - goes to the [facility] for primary care and gets his medication there also; back up pharmacy is [facility]; he is refusing SNF placement at this time and wants to return home with [facility] services provided by [facility]; CM called [facility] and he is active; at discharge they are requesting a DC summary and orders to be faxed to [phone]; DME - he has home oxygen, CPAP, electric wheelchair at home, walker; CM will continue to follow for progression of care  Expected Discharge Date:  10/05/17               Expected Discharge Plan:  Home w Home Health Services  Discharge planning Services  CM Consult  Choice offered to:  Patient  HH Arranged:  RN, PT, OT, Nurse's Aide, Disease Management HH Agency:  [facility]  Status of Service:  In process, will continue to follow  Reola Mosher [phone] 10/04/2017, 11:53 AM

## 2017-10-04 NOTE — Progress Notes (Signed)
Pt called his wife from his cell phone and RN spoke with her. Pt's wife states she understands that staff cannot assist pt into their vehicle and that she will bring someone to help her.

## 2017-10-06 LAB — CULTURE, BLOOD (ROUTINE X 2)
Culture: NO GROWTH
Culture: NO GROWTH
Special Requests: ADEQUATE
Special Requests: ADEQUATE

## 2018-10-14 DEATH — deceased
